# Patient Record
Sex: Female | Born: 1970 | Race: White | Hispanic: No | Marital: Single | Smoking: Current every day smoker
Health system: Southern US, Community
[De-identification: ages and names within clinical notes are randomized; demographics above are authoritative.]

## PROBLEM LIST (undated history)

## (undated) DIAGNOSIS — Z9889 Other specified postprocedural states: Secondary | ICD-10-CM

## (undated) DIAGNOSIS — R112 Nausea with vomiting, unspecified: Secondary | ICD-10-CM

## (undated) DIAGNOSIS — F32A Depression, unspecified: Secondary | ICD-10-CM

## (undated) DIAGNOSIS — E039 Hypothyroidism, unspecified: Secondary | ICD-10-CM

## (undated) DIAGNOSIS — G43909 Migraine, unspecified, not intractable, without status migrainosus: Secondary | ICD-10-CM

## (undated) DIAGNOSIS — F419 Anxiety disorder, unspecified: Secondary | ICD-10-CM

## (undated) DIAGNOSIS — R011 Cardiac murmur, unspecified: Secondary | ICD-10-CM

## (undated) DIAGNOSIS — J45909 Unspecified asthma, uncomplicated: Secondary | ICD-10-CM

## (undated) DIAGNOSIS — I10 Essential (primary) hypertension: Secondary | ICD-10-CM

## (undated) DIAGNOSIS — D1801 Hemangioma of skin and subcutaneous tissue: Secondary | ICD-10-CM

## (undated) DIAGNOSIS — F1010 Alcohol abuse, uncomplicated: Secondary | ICD-10-CM

## (undated) HISTORY — DX: Other specified postprocedural states: Z98.890

## (undated) HISTORY — PX: HEMANGIOMA EXCISION: SHX1734

## (undated) HISTORY — PX: LAPAROSCOPIC, ABDOMINAL EXPLORATION: SHX4470

## (undated) HISTORY — DX: Essential (primary) hypertension: I10

## (undated) HISTORY — DX: Hemangioma of skin and subcutaneous tissue: D18.01

## (undated) HISTORY — DX: Alcohol abuse, uncomplicated: F10.10

## (undated) HISTORY — PX: TONSILLECTOMY: SUR1361

## (undated) HISTORY — DX: Anxiety disorder, unspecified: F41.9

## (undated) HISTORY — PX: OTHER SURGICAL HISTORY: SHX169

## (undated) HISTORY — DX: Depression, unspecified: F32.A

## (undated) HISTORY — DX: Cardiac murmur, unspecified: R01.1

---

## 2001-03-30 ENCOUNTER — Emergency Department: Admit: 2001-03-30 | Payer: Self-pay | Source: Emergency Department | Admitting: Emergency Medicine

## 2005-09-28 ENCOUNTER — Ambulatory Visit
Admit: 2005-09-28 | Disposition: A | Discharge status: Home / Self Care | Payer: Self-pay | Source: Ambulatory Visit | Admitting: Body Imaging

## 2005-11-29 ENCOUNTER — Ambulatory Visit
Admit: 2005-11-29 | Disposition: A | Discharge status: Home / Self Care | Payer: Self-pay | Source: Ambulatory Visit | Admitting: Body Imaging

## 2009-03-12 ENCOUNTER — Ambulatory Visit: Admit: 2009-03-12 | Disposition: A | Discharge status: Home / Self Care | Payer: Self-pay | Source: Ambulatory Visit

## 2009-07-09 ENCOUNTER — Emergency Department: Admit: 2009-07-09 | Payer: Self-pay | Source: Emergency Department | Admitting: Emergency Medicine

## 2009-08-23 HISTORY — PX: THYROID SURGERY: SHX805

## 2009-08-23 HISTORY — PX: THYROIDECTOMY: SHX17

## 2009-09-12 ENCOUNTER — Ambulatory Visit
Admit: 2009-09-12 | Disposition: A | Discharge status: Home / Self Care | Payer: Self-pay | Source: Ambulatory Visit | Admitting: Surgery

## 2009-09-12 LAB — BASIC METABOLIC PANEL
BUN: 12 mg/dL (ref 7.0–19.0)
CO2: 24 mEq/L (ref 22–29)
Calcium: 9.7 mg/dL (ref 8.5–10.5)
Chloride: 103 mEq/L (ref 98–107)
Creatinine: 0.8 mg/dL (ref 0.6–1.0)
Glucose: 71 mg/dL (ref 70–100)
Potassium: 4.6 mEq/L (ref 3.5–5.1)
Sodium: 138 mEq/L (ref 136–145)

## 2009-09-12 LAB — CBC AND DIFFERENTIAL
Basophils Absolute: 0 10*3/uL (ref 0.00–0.20)
Basophils: 0 % (ref 0–2)
Eosinophils Absolute: 0.1 10*3/uL (ref 0.00–0.70)
Eosinophils: 1 % (ref 0–5)
Granulocytes Absolute: 5.5 10*3/uL (ref 1.80–8.10)
Hematocrit: 42.8 % (ref 37.0–47.0)
Hgb: 14.2 g/dL (ref 12.0–16.0)
Immature Granulocytes Absolute: 0.02 10*3/uL
Immature Granulocytes: 0 % (ref 0–1)
Lymphocytes Absolute: 2.3 10*3/uL (ref 0.50–4.40)
Lymphocytes: 28 % (ref 15–41)
MCH: 29.1 pg (ref 28.0–32.0)
MCHC: 33.2 g/dL (ref 32.0–36.0)
MCV: 87.7 fL (ref 80.0–100.0)
MPV: 10.3 fL (ref 9.4–12.3)
Monocytes Absolute: 0.5 10*3/uL (ref 0.00–1.20)
Monocytes: 6 % (ref 0–11)
Neutrophils %: 66 % (ref 52–75)
Platelets: 280 10*3/uL (ref 140–400)
RBC: 4.88 10*6/uL (ref 4.20–5.40)
RDW: 13 % (ref 12–15)
WBC: 8.41 10*3/uL (ref 3.50–10.80)

## 2009-09-12 LAB — HEMOLYSIS INDEX: Hemolysis Index: 4

## 2009-09-12 LAB — GFR: EGFR: >60

## 2009-09-22 ENCOUNTER — Ambulatory Visit: Payer: Self-pay

## 2009-09-22 ENCOUNTER — Ambulatory Visit
Admission: RE | Admit: 2009-09-22 | Disposition: A | Discharge status: Home / Self Care | Payer: Self-pay | Source: Ambulatory Visit | Admitting: Surgery

## 2009-09-22 LAB — MAGNESIUM: Magnesium: 1.9 mg/dL (ref 1.6–2.3)

## 2009-09-22 LAB — CALCIUM: Calcium: 9.4 mg/dL (ref 8.6–10.2)

## 2009-09-23 LAB — MAGNESIUM: Magnesium: 2 mg/dL (ref 1.6–2.3)

## 2009-09-23 LAB — CALCIUM: Calcium: 9.2 mg/dL (ref 8.6–10.2)

## 2009-09-30 ENCOUNTER — Ambulatory Visit
Admit: 2009-09-30 | Disposition: A | Discharge status: Home / Self Care | Payer: Self-pay | Source: Ambulatory Visit | Admitting: "Endocrinology

## 2009-09-30 LAB — BASIC METABOLIC PANEL
BUN: 10 mg/dL (ref 7.0–19.0)
CO2: 23 mEq/L (ref 22–29)
Calcium: 9.5 mg/dL (ref 8.5–10.5)
Chloride: 106 mEq/L (ref 98–107)
Creatinine: 0.7 mg/dL (ref 0.6–1.0)
Glucose: 75 mg/dL (ref 70–100)
Potassium: 4.8 mEq/L (ref 3.5–5.1)
Sodium: 140 mEq/L (ref 136–145)

## 2009-09-30 LAB — HEMOLYSIS INDEX: Hemolysis Index: 6

## 2009-09-30 LAB — GFR: EGFR: >60

## 2011-02-14 ENCOUNTER — Emergency Department
Admit: 2011-02-14 | Disposition: A | Discharge status: Home / Self Care | Payer: Self-pay | Source: Emergency Department | Admitting: Emergency Medicine

## 2011-02-23 ENCOUNTER — Emergency Department: Admit: 2011-02-23 | Disposition: A | Discharge status: Home / Self Care | Payer: Self-pay | Source: Emergency Department

## 2011-02-25 ENCOUNTER — Emergency Department: Admit: 2011-02-25 | Disposition: A | Discharge status: Home / Self Care | Payer: Self-pay | Source: Emergency Department

## 2011-03-03 ENCOUNTER — Emergency Department: Admit: 2011-03-03 | Disposition: A | Discharge status: Home / Self Care | Payer: Self-pay | Source: Emergency Department

## 2011-05-07 LAB — ECG 12-LEAD
Atrial Rate: 71 {beats}/min
P Axis: 41 degrees
P-R Interval: 146 ms
Q-T Interval: 396 ms
QRS Duration: 68 ms
QTC Calculation (Bezet): 430 ms
R Axis: 48 degrees
T Axis: 50 degrees
Ventricular Rate: 71 {beats}/min

## 2011-06-11 NOTE — Op Note (Unsigned)
Anna Lamb, Anna Lamb      MRN:          [id number]      Account:      [id number]      Document ID:  [id number]      Procedure Date: 09/22/2009            Admit Date: 09/22/2009            Patient Location: F1076-02      Patient Type: A            SURGEON: Anselm Pancoast Giuliana Handyside MD      ASSISTANT:  Baxter Hire A Rupell PA                  PREOPERATIVE DIAGNOSES:      1.  Multinodular thyroid goiter.      2.  Dominant right thyroid nodule.      3.  Small left nodule with calcification.            POSTOPERATIVE DIAGNOSES:      1.  Multinodular thyroid goiter.      2.  Dominant right thyroid nodule.      3.  Small left nodule with calcification.            TITLE OF PROCEDURE:      Total thyroidectomy.            COMPLICATIONS:      None.            ESTIMATED BLOOD LOSS:      Minimal.            ANESTHESIA:      General.            INDICATIONS:      The patient is a 40 year old woman kindly referred by her endocrinologist,      Dr. Ella Jubilee, and primary physician, Dr. Otis Brace, for surgical      evaluation.  The patient's history and lab values were reviewed.  The      rationale for considering operation on the thyroid was discussed in detail      including risks and benefits.  With full understanding and without      reservation, the patient elected to proceed with operative removal of the      thyroid on this date.            On the day of surgery, I met with the patient preoperatively.  Final      questions were answered.  The boarding pass and informed consent were      complete.  She was covered prophylactically with vancomycin secondary to a      PENICILLIN allergy and she was taken to the operating room.                                         Page 1 of 3      Anna Lamb, Anna Lamb      MRN:          [id number]      Account:      [id number]      Document ID:  [id number]      Procedure Date: 09/22/2009            DESCRIPTION OF PROCEDURE:      The patient was placed on the operative table  in the supine position.       General anesthesia was induced.  The patient required multiple attempts at      intubation, finally intubated with the Bullard technique.  This precluded      the use of the Nerve Integrity Monitoring System.  Once the patient was      intubated, her neck was positioned and then prepped and draped in the usual      sterile fashion.  The neck was entered through a transverse incision      paralleling a skin fold to help optimize cosmesis.  The incision was      carried down through the platysma.  Skin flaps were elevated and held in      position with silk suture.  The strap muscles were divided in the midline      and attention first focused to the left thyroid lobe.  The lobe was of      normal size.  It was slightly stiffer than normal, suggesting a component      of thyroiditis.  There were several small lymph nodes adjacent to the      thyroid, again typical of the inflammatory change rather than malignancy.      As the gland was rotated anteriorly and medially, the middle thyroid vein      was isolated, ligated with silk, and divided on the thyroid side with the      Harmonic scalpel.  With further rotation of the gland, the posterior      elements, including the inferior thyroid artery and the nerve, were      identified.  Attention was then focused to the upper pole, which was      retracted laterally and inferiorly, exposing the superior vessels.  Once      these were isolated, they were ligated with 2-0 silk and divided with the      Harmonic scalpel.  Remaining vessels penetrating into the upper pole were      taken directly on the thyroid capsule to preserve the superior parathyroid      on the left.  When the upper pole was mobilized, attention was focused      inferiorly.  The veins penetrating into the lower pole of the left lobe and      isthmus were isolated directly on the capsule of the gland, ligated with      silk suture, and again divided with the Harmonic scalpel.  Once the upper      and  lower poles were mobilized, the gland could then be completely      delivered out of the neck and rotated anteriorly on the trachea.  The      nerve, which was in full view was tracked up to its entrance through Ailey      ligament into the thyrocricoid membrane; and only when the nerve was in      full view, were remaining vessels penetrating into the left lobe taken in a      caudal-to-cephalad direction until the entire left lobe and isthmus was      released off the pretracheal fascia.  At this point, attention was focused      to the right lobe.  The right lobe of the thyroid was exposed by retracting      the muscles laterally.  The right lobe was essentially replaced by a      dominant nodule encompassing the majority of the gland.  There did not      appear to be any gross penetration of the nodule through the capsule nor      any extrathyroidal masses of concern.  Again, the middle thyroid vein was      isolated, ligated, and divided and the upper and lower poles were liberated      from their blood supply in similar fashion as described on the left.      During dissection, both parathyroids were again visualized and preserved.      Once the upper and lower poles were released from their vascular supply,      the gland was completely delivered out of the neck and rotated medially.      With the nerve again in full view, remaining vessels were taken in a                                   Page 2 of 3      Anna Lamb, Anna Lamb      MRN:          [id number]      Account:      [id number]      Document ID:  [id number]      Procedure Date: 09/22/2009            caudal-to-cephalad direction until the entire thyroid was released and      delivered out of the neck.  A silk stitch was placed at the upper pole of      the right lobe for pathologic orientation.  The neck was checked and was      dry.  The nerves were reevaluated and were completely intact.  The      parathyroids were viable.  With no bleeding, decision was  made not to leave      a drain.  The midline was closed with running 3-0 Vicryl, subcutaneous      tissue and platysma with inverting simple 4-0 Vicryl, and the skin with      Monocryl.  Wounds were cleansed and dressed sterilely.  The patient      tolerated the procedure well, had no intraoperative complication, and was      transferred to the outpatient recovery area in stable condition.  All      sponge, needle, and instrument counts reported as correct by the nursing      staff at the conclusion of the case.                        Electronic Signing Provider            D:  09/22/2009 09:22 AM by Dr. Arnette Norris, MD [phone])      T:  09/22/2009 11:58 AM by Jonathon Bellows                  MW:NUUVOZD Corrin Parker MD      Ella Jubilee MD      Phillis Knack MD                                   Page 3 of 3      Authenticated by Arnette Norris, MD On 09/25/2009 02:03:33 PM

## 2012-09-05 ENCOUNTER — Emergency Department
Admission: EM | Admit: 2012-09-05 | Discharge: 2012-09-05 | Disposition: A | Discharge status: Home / Self Care | Payer: BLUE CROSS/BLUE SHIELD | Attending: Emergency Medicine | Admitting: Emergency Medicine

## 2012-09-05 ENCOUNTER — Emergency Department: Payer: BLUE CROSS/BLUE SHIELD

## 2012-09-05 DIAGNOSIS — S93409A Sprain of unspecified ligament of unspecified ankle, initial encounter: Secondary | ICD-10-CM | POA: Insufficient documentation

## 2012-09-05 DIAGNOSIS — X500XXA Overexertion from strenuous movement or load, initial encounter: Secondary | ICD-10-CM | POA: Insufficient documentation

## 2012-09-05 DIAGNOSIS — Z882 Allergy status to sulfonamides status: Secondary | ICD-10-CM | POA: Insufficient documentation

## 2012-09-05 DIAGNOSIS — F17200 Nicotine dependence, unspecified, uncomplicated: Secondary | ICD-10-CM | POA: Insufficient documentation

## 2012-09-05 DIAGNOSIS — X503XXA Overexertion from repetitive movements, initial encounter: Secondary | ICD-10-CM | POA: Insufficient documentation

## 2012-09-05 DIAGNOSIS — Z880 Allergy status to penicillin: Secondary | ICD-10-CM | POA: Insufficient documentation

## 2012-09-05 MED ORDER — ACETAMINOPHEN 325 MG PO TABS
650.0000 mg | ORAL_TABLET | Freq: Once | ORAL | Status: AC
Start: 2012-09-05 — End: 2012-09-05
  Administered 2012-09-05: 650 mg via ORAL
  Filled 2012-09-05: qty 2

## 2012-09-05 NOTE — ED Provider Notes (Signed)
EMERGENCY DEPARTMENT HISTORY AND PHYSICAL EXAM     Physician/Midlevel provider first contact with patient: 09/05/12 0919         Date: 09/05/2012  Patient Name: Anna Lamb  Attending Physician: Ok Edwards MD  Diagnosis and Treatment Plan       Clinical Impression:   1. Ankle sprain        Treatment Plan:   ED Disposition     Discharge [name] discharge to home/self care.    Condition at discharge: Stable            History of Presenting Illness     Chief Complaint   Patient presents with   . Foot Injury       History Provided By: Patient    Chief Complaint: Foot Pain  Onset: Today AM  Timing: Suddenly  Location: Right Foot  Quality: Painful Swelling  Severity: Moderate  Modifying Factors:   Associated Symptoms:     Additional History: Anna Lamb is a 42 y.o. female pt c/o right foot pain since this morning. Pt reports tying her shoes when she lost her balance and "heard two pops." Pt is able to bear weight and says she walks with a limp. Ecchymosis is noted to right foot. Pt is requesting X-Ray.   Denies any other injury.     PCP: Milas Kocher, MD      Current Facility-Administered Medications   Medication Dose Route Frequency Provider Last Rate Last Dose   . [COMPLETED] acetaminophen (TYLENOL) tablet 650 mg  650 mg Oral Once Ok Edwards, MD   650 mg at 09/05/12 1610     No current outpatient prescriptions on file.       Past Medical History     History reviewed. No pertinent past medical history.  Past Surgical History   Procedure Date   . Thyroid surgery        Family History     No family history on file.    Social History     History     Social History   . Marital Status: Single     Spouse Name: N/A     Number of Children: N/A   . Years of Education: N/A     Social History Main Topics   . Smoking status: Current Every Day Smoker   . Smokeless tobacco: Not on file   . Alcohol Use: No   . Drug Use:    . Sexually Active: Not on file     Other Topics Concern   . Not on file     Social  History Narrative   . No narrative on file        Allergies     Allergies   Allergen Reactions   . Penicillin V Potassium Hives   . Sulfa Antibiotics Hives       Review of Systems     Review of Systems:  General: negative for chills, fever or malaise  Psychological: negative for depression or suicidal ideation  ENT: negative for epistaxis, nasal congestion or sore throat  Respiratory: no cough, shortness of breath, or wheezing  Cardiovascular: no chest pain or dyspnea on exertion  Gastrointestinal: no abdominal pain, change in bowel habits, or black or bloody stools  Genito-Urinary: no dysuria, trouble voiding, or hematuria  Musculoskeletal: +Right Foot Pain  Neurological: no TIA or stroke symptoms  Dermatological: negative for pruritus and rash; + Ecchymosis to right foot    Physical Exam  BP 114/57  Pulse 86  Temp 97.9 F (36.6 C)  Resp 16  Ht 1.676 m  Wt 74.844 kg  BMI 26.64 kg/m2  SpO2 97%  LMP 08/17/2012    Constitutional: Vital signs reviewed. Well appearing, no apparent distress  Neuro: alert and oriented x 3, CN II - XII intact, upper and lower extremities 5/5 strength intact, sensation intact, normal gait  Head: Normocephalic, atraumatic. No external trauma noted.  Eyes: Conjunctiva and sclera are normal. Extraocular movements intact, pupils equal, round, reactive  Ear, Nose, Throat:  Normal external examination of the nose and ears. Oropharynx clear, no tonsillar exudates. Uvula midline.  Neck: Normal range of motion. Trachea midline. No midline cervical spine tenderness. No cervical lymphadenopathy  Respiratory/Chest: Clear to auscultation. No respiratory distress. No wheezing, rhonchi or rales.  Cardiovascular: Regular rate and rhythm. No murmurs.  Abdomen: Soft. Non-tender to palpation. No masses. No guarding or rebound.  Back: No midline tenderness, no CVA tenderness.   Extremities: Upper and lower extremities with no edema, no cyanosis. No tenderness over medial/latearl malleolus no 5th  metatarsal tenderness  Skin: Mild ecchymosis over proximal dorsum over right foot    Diagnostic Study Results     Labs -     Results     ** No Results found for the last 24 hours. **          Radiologic Studies -   Radiology Results (24 Hour)     Procedure Component Value Units Date/Time    Ankle Right 3+ Views [[id number]] Collected:09/05/12 0953    Order Status:Completed  Updated:09/05/12 1000    Narrative:    HISTORY: Pain after trauma     EXAM: Right ankle, 4 views     FINDINGS: There is a no acute fracture or subluxation seen. The mortise  is intact. Minimal soft tissue is seen. No focal articular abnormality  is identified.     Two small 3 mm cortical densities are seen off the navicular on the  lateral view consistent with old trauma.       Impression:      1. No acute fracture or subluxation seen. Findings consistent with old  trauma.      .    Medical Decision Making   I am the first provider for this patient.    I reviewed the vital signs, available nursing notes, past medical history, past surgical history, family history and social history.    Vital Signs-Reviewed the patient's vital signs.     Patient Vitals for the past 12 hrs:   BP Temp Pulse Resp   09/05/12 0908 114/57 mmHg 97.9 F (36.6 C) 86  16        ED Course:   10:08 AM  Discussed test results with pt and counseled on diagnosis, f/u plans, and signs and symptoms when to return to ED.  Pt is stable and ready for discharge.      Provider Notes: fall, twisted ankle, likely sprained  Patient requested x-ray despite negative [place] ankle rules  XR negative, will ace wrap and give RICE instructions      Doctor's Notes     Throughout the stay in the Emergency Department, questions and concerns surrounding pain control, care plans, diagnostic studies, effects of medications administered or prescribed, and future prognostic dilemmas were assessed and addressed.    ROS addendum: The patient and/or family was asked if they had any other complaints or  concerns that we could address today and nothing of  significance was noted.     _______________________________  Medical DeMedical Decision Makingcision Stacy Gardner  Attestations:    This note is prepared by Davina Poke, acting as Scribe for Ok Edwards, MD.     Ok Edwards, MD: The scribe's documentation has been prepared under my direction and personally reviewed by me in its entirety.  I confirm that the note above accurately reflects all work, treatment, procedures, and medical decision making performed by me.          Ok Edwards, MD  09/05/12 1024

## 2012-09-05 NOTE — ED Notes (Signed)
Report of tieing her shoes while standing lost balance heard 2 pops to the right foot and fell. Unable to bear weight initially. Now able to bear weight with pain. Discoloration and swelling noted to the lateral of the foot proximal to the ankle.

## 2012-09-05 NOTE — Discharge Instructions (Signed)
Ankle Sprain    You have been diagnosed with an ankle sprain.    A sprain is a ligament injury, usually a tear or partial tear. Sprains can hurt as much as broken bones. Sprains can be classified by the degree of injury. A first-degree sprain is considered a minor tear. A second-degree sprain is a partial tear of the ligament. A third-degree sprain often involves a small fracture, or break, of the bone that the ligament is attached to.    Sprains are usually treated with pain medication and a splint to keep the joint from moving. You should Rest, Ice, Compress, and Elevate the injured ankle. Remember this as "RICE."   REST: Limit the use of the injured body part.   ICE: By applying ice to the affected area, swelling and pain can be reduced. Place some ice cubes in a re-sealable (Ziploc) bag and add some water. Put a thin washcloth between the bag and the skin. Apply the ice bag to the area for at least 20 minutes. Do this at least 4 times per day. Using the ice for longer times and more frequently is OK. NEVER APPLY ICE DIRECTLY TO THE SKIN.   COMPRESS: Compression means to apply pressure around the injured area such as with a splint, cast or an ace bandage. Compression decreases swelling and improves comfort. Compression should be tight enough to relieve swelling but not so tight as to decrease circulation. Increasing pain, numbness, tingling, or change in skin color, are all signs of decreased circulation.   ELEVATE: Elevate the injured part. For example, a sprained ankle can be placed up on a chair while sitting and propped up on pillows while lying down.    You have been given an ACE BANDAGE. The bandage will compress the ankle. This increases comfort and reduces swelling. The ACE bandage should fit snugly but not so tight as to decrease circulation (blood supply). Watch for swelling of the area outside the ace wrap. Check capillary refill (circulation) in your toenails. To do this, press on the nail. It  should turn white. When you let go, the nail should return to pink in less than 2 seconds. If it doesn't, the bandage is too tight. Loosen the wrap if you need to.    Wear the ACE bandage:   For the next 1-2 weeks.    Ankle exercises are described below. Begin the exercises as soon as you are able. They will make the ankle stronger to prevent new injuries. Do the exercises 5 to 10 times each day.   Use your big toe to draw out the letters of the alphabet on the ground. Move your ankle as you make each letter.   Sit with your leg straight out in front of you. Wrap a towel around the ball of your foot (just below your toes) and pull back. Pull hard enough to stretch the ankle. Don't pull hard enough to cause pain. Hold the stretch for 30 seconds.   Stand up. Rock onto the tiptoes of the injured foot and then return to the flat position. Repeat 10 times.   Rotate your ankle in a circle. Make 10 clockwise circles, then make 10 circles going the other way.    YOU SHOULD SEEK MEDICAL ATTENTION IMMEDIATELY, EITHER HERE OR AT THE NEAREST EMERGENCY DEPARTMENT, IF ANY OF THE FOLLOWING OCCURS:   Your pain gets much worse.   Your ankle or foot starts to tingle or it becomes numb.   Your foot   is cold or pale. This might mean there is a problem with circulation (blood supply).        R.I.C.E.  R.I.C.E. stands for Rest, Ice, Compression, and Elevation. Doing these things helps limit pain and swelling after an injury. R.I.C.E. also helps injuries heal faster. Use R.I.C.E. for sprains, strains, and severe bruises or bumps. Follow the tips on this handout and begin R.I.C.E. as soon as possible after an injury.   Rest  Pain is your body's way of telling you to rest an injured area. Whether you have hurt an elbow, hand, foot, or knee, limiting its use will prevent further injury and help you heal.   Ice  Applying ice right after an injury helps prevent swelling and reduce pain. Don't place ice directly on your  skin.   Wrap a cold pack or bag of ice in a thin cloth. Place it over the injured area.   Ice for63minutes every3hours. Don't ice for more than51minutes at a time.   Compression  Putting pressure (compression) on an injury helps prevent swelling and provides support.   Wrap the injured area firmly with an elastic bandage. If your hand or foot tingles, becomes discolored, or feels cold to the touch, the bandage may be too tight. Rewrap it more loosely.   If your bandage becomes too loose, rewrap it.   Do not wear an elastic bandage overnight.   Elevation   Keeping an injury elevated helps reduce swelling, pain, and throbbing. Elevation is most effectivewhen the injury is kept elevated higher than the heart.    Call your health care provider if you notice any of the following:   Fingers or toes feel numb, are cold to the touch, or change color   Skin looks shiny or tight   Pain, swelling, or bruising worsens and is not improved with elevation    [address], [address] [place], [place] [address]. All rights reserved. This information is not intended as a substitute for professional medical care. Always follow your healthcare professional's instructions.

## 2013-04-18 ENCOUNTER — Ambulatory Visit (INDEPENDENT_AMBULATORY_CARE_PROVIDER_SITE_OTHER): Payer: BLUE CROSS/BLUE SHIELD

## 2013-04-19 ENCOUNTER — Ambulatory Visit (INDEPENDENT_AMBULATORY_CARE_PROVIDER_SITE_OTHER): Payer: BLUE CROSS/BLUE SHIELD | Admitting: Psychiatry

## 2013-04-19 DIAGNOSIS — F411 Generalized anxiety disorder: Secondary | ICD-10-CM

## 2013-04-19 NOTE — Progress Notes (Signed)
OUTPATIENT INITIAL PSYCHIATRIC EVALUATION    Date of evaluation: 04/19/13    Chief Complaint:  Depression and anxiety    History of Presenting Illness:  Patient is a 42 y.o. white female who presents for an initial psychiatric evaluation.  Pt has been out on leave, and states that she came to get a letter requesting that she return to work under a Radiographer, therapeutic.  Pt states that she has been an anxious person, but denies pre-existing history of depression, states that she was having depression since January 2014 and was on cymbalta eventually titrated to 90 mg daily, though primarily for anxiety.  Pt states that recently she got into argument with her supervisor and her supervisor said "I could understand why people like Brandt Loosen kill themselves," but pt denies that by this comment she was having any SI, intent, plan.  Pt states that supervisor misinterpreted the statement and eventually [facility] came to evaluate the patient.  Pt states that they did not deem her a risk to herself; pt states that she told evaluator that she had thoughts of going across the desk and hurting her supervisor.  Pt however again states that she was not having homicidal thoughts, intent or plan, but that she was mainly angry.  She said the evaluator suggested that pt go to [facility] to "cover my ass," so that pt could keep her job.  Pt was at [facility] for 2 days.  Pt denies having any current SI/HI, intent or plan, history of suicide attempts or violence.  Denies history of mania, avh, delusions.    Past Psychiatric History:   Inpatient tx: yes as per HPI  Outpt psychiatric tx and medication trials: cymbalta  Therapy: yes - currently seeing a therapist    Past Medical and Surgical History:    Multinodular thyroid goiter    Past Surgical History   Procedure Date   . Thyroid surgery       Medications:  Cymbalta 90 mg qhs  Albuterol  Fluticasone-salmeterol  Synthroid    Allergies:   Allergies   Allergen  Reactions   . Penicillin V Potassium Hives   . Sulfa Antibiotics Hives     Review of systems:  All systems reviewed and are negative except as noted above and in the history of present illness.    Family History:  No family history on file.     Substance Use History:  Denies h/o drug or etoh abuse.    Social history:  Works for [employer] Status Evaluation:  General Appearance and Manner: Grooming, hygiene, dress appropriate, appears stated age, average weight.  Cooperative.  Eye contact appropriate.    Psychomotor: No abnormal movements, tremors, gait or posture disturbances.    Speech: Normal rate, volume, articulation, spontaneous.  Mood: Anxious  Affect: Mood-congruent, range normal.  Thought process: Logical, sequential, and goal directed.  Associations normal.  Thought content: No delusions, AVH.  No SI/HI.  Insight: poor  Judgment: fair  Cognition:     Orientation: AOx 3    Memory: Intact    Concentration: Intact    Language: Intact    Fund of knowledge: Intact    Abstract abilities: Intact      DIAGNOSES:  Axis I:  Anxiety disorder NOS  Axis II: Defer  Axis III:   Multinodular thyroid goiter  Past Surgical History   Procedure Date   . Thyroid surgery     Axis IV: Occupational problems  Axis V: 63.            ASSESSMENT / PLAN  - Pt presents with anxiety and depression, on cymbalta 90 mg qhs, currently treated by her PCP.  Was hospitalized briefly at [facility] as pt states that she made statements at work that led her supervisor to be concerned and eventually was evaluated by [facility].  Pt denied that she actually had any suicidal ideation or homicidal ideation then or now, and has not had any intent or plans to hurt herself, her supervisor or anyone else.  Denies history of suicide attempts or violence.  Pt has been on administrative leave, and was mainly upset and frustrated with her supervisor the day she was hospitalized.  She requested a letter stating that she should  return to work under a Radiographer, therapeutic, and I advised pt that I could not write this letter today.  Pt did not want to continue psychiatric treatment with this provider.  Pt was offered referrals.  Pt refused these referrals, but will f/u with her primary care doctor.  - Potential risks/benefits/alternatives of medications and treatment plan discussed with patient, who expressed understanding of our discussion and gave informed consent to the treatment plan.  - Psychotherapy: Supportive therapy and education provided to the patient.    - Substance abuse tx: Advised patient to limit and abstain from etoh/drug use, and to take medications appropriately as prescribed.    - Patient agrees with safety plan which includes calling 911 / [facility] or going to the nearest ER / [facility] if the pt is in crisis or experiencing an emergency.    Terrilyn Saver, MD

## 2014-07-19 ENCOUNTER — Emergency Department: Payer: BLUE CROSS/BLUE SHIELD

## 2014-07-19 ENCOUNTER — Emergency Department
Admission: EM | Admit: 2014-07-19 | Discharge: 2014-07-19 | Disposition: A | Discharge status: Home / Self Care | Payer: BLUE CROSS/BLUE SHIELD | Attending: Emergency Medicine | Admitting: Emergency Medicine

## 2014-07-19 DIAGNOSIS — R911 Solitary pulmonary nodule: Secondary | ICD-10-CM

## 2014-07-19 DIAGNOSIS — F17210 Nicotine dependence, cigarettes, uncomplicated: Secondary | ICD-10-CM | POA: Insufficient documentation

## 2014-07-19 DIAGNOSIS — J45909 Unspecified asthma, uncomplicated: Secondary | ICD-10-CM | POA: Insufficient documentation

## 2014-07-19 DIAGNOSIS — K529 Noninfective gastroenteritis and colitis, unspecified: Secondary | ICD-10-CM | POA: Insufficient documentation

## 2014-07-19 DIAGNOSIS — E079 Disorder of thyroid, unspecified: Secondary | ICD-10-CM | POA: Insufficient documentation

## 2014-07-19 HISTORY — DX: Depression, unspecified: F32.A

## 2014-07-19 HISTORY — DX: Anxiety disorder, unspecified: F41.9

## 2014-07-19 HISTORY — DX: Migraine, unspecified, not intractable, without status migrainosus: G43.909

## 2014-07-19 LAB — CBC AND DIFFERENTIAL
Basophils Absolute Automated: 0.03 10*3/uL (ref 0.00–0.20)
Basophils Automated: 0 %
Eosinophils Absolute Automated: 0.21 10*3/uL (ref 0.00–0.70)
Eosinophils Automated: 3 %
Hematocrit: 41.3 % (ref 37.0–47.0)
Hgb: 14.2 g/dL (ref 12.0–16.0)
Lymphocytes Absolute Automated: 1.88 10*3/uL (ref 0.50–4.40)
Lymphocytes Automated: 25 %
MCH: 30.7 pg (ref 28.0–32.0)
MCHC: 34.4 g/dL (ref 32.0–36.0)
MCV: 89.2 fL (ref 80.0–100.0)
MPV: 9.2 fL — ABNORMAL LOW (ref 9.4–12.3)
Monocytes Absolute Automated: 0.77 10*3/uL (ref 0.00–1.20)
Monocytes: 10 %
Neutrophils Absolute: 4.67 10*3/uL (ref 1.80–8.10)
Neutrophils: 62 %
Platelets: 269 10*3/uL (ref 140–400)
RBC: 4.63 10*6/uL (ref 4.20–5.40)
RDW: 13 % (ref 12–15)
WBC: 7.56 10*3/uL (ref 3.50–10.80)

## 2014-07-19 LAB — URINALYSIS, REFLEX TO MICROSCOPIC EXAM IF INDICATED
Bilirubin, UA: NEGATIVE
Glucose, UA: NEGATIVE
Ketones UA: NEGATIVE
Leukocyte Esterase, UA: NEGATIVE
Nitrite, UA: NEGATIVE
Protein, UR: NEGATIVE
Specific Gravity UA: 1.01 (ref 1.001–1.035)
Urine pH: 6 (ref 5.0–8.0)
Urobilinogen, UA: NORMAL mg/dL

## 2014-07-19 LAB — COMPREHENSIVE METABOLIC PANEL
ALT: 9 U/L (ref 0–55)
AST (SGOT): 18 U/L (ref 5–34)
Albumin/Globulin Ratio: 1.8 (ref 0.9–2.2)
Albumin: 4 g/dL (ref 3.5–5.0)
Alkaline Phosphatase: 47 U/L (ref 37–106)
Anion Gap: 6 (ref 5.0–15.0)
BUN: 10 mg/dL (ref 7.0–19.0)
Bilirubin, Total: 0.5 mg/dL (ref 0.2–1.2)
CO2: 23 mEq/L (ref 22–29)
Calcium: 8.8 mg/dL (ref 8.5–10.5)
Chloride: 110 mEq/L (ref 100–111)
Creatinine: 0.8 mg/dL (ref 0.6–1.0)
Globulin: 2.2 g/dL (ref 2.0–3.6)
Glucose: 87 mg/dL (ref 70–100)
Potassium: 4.4 mEq/L (ref 3.5–5.1)
Protein, Total: 6.2 g/dL (ref 6.0–8.3)
Sodium: 139 mEq/L (ref 136–145)

## 2014-07-19 LAB — LIPASE: Lipase: 39 U/L (ref 8–78)

## 2014-07-19 LAB — URINE HCG QUALITATIVE: Urine HCG Qualitative: NEGATIVE

## 2014-07-19 LAB — GFR: EGFR: >60

## 2014-07-19 MED ORDER — ONDANSETRON 4 MG PO TBDP
4.0000 mg | ORAL_TABLET | Freq: Four times a day (QID) | ORAL | Status: DC | PRN
Start: 2014-07-19 — End: 2018-02-03

## 2014-07-19 MED ORDER — DIPHENOXYLATE-ATROPINE 2.5-0.025 MG PO TABS
1.0000 | ORAL_TABLET | Freq: Four times a day (QID) | ORAL | Status: DC | PRN
Start: 2014-07-19 — End: 2018-01-25

## 2014-07-19 MED ORDER — SODIUM CHLORIDE 0.9 % IV BOLUS
1000.0000 mL | Freq: Once | INTRAVENOUS | Status: AC
Start: 2014-07-19 — End: 2014-07-19
  Administered 2014-07-19: 1000 mL via INTRAVENOUS

## 2014-07-19 MED ORDER — IOHEXOL 350 MG/ML IV SOLN
100.0000 mL | Freq: Once | INTRAVENOUS | Status: AC | PRN
Start: 2014-07-19 — End: 2014-07-19
  Administered 2014-07-19: 100 mL via INTRAVENOUS

## 2014-07-19 NOTE — Discharge Instructions (Signed)
Enteritis    You have been diagnosed with acute enteritis.    Enteritis is an inflammation of the stomach and small intestine. Food with bacteria, bacterial toxins or viruses is often the cause.    Enteritis symptoms include crampy abdominal (belly) pain. They sometimes include fever (temperature higher than 100.27F / 38C) and/or diarrhea. The diarrhea is sometimes bloody.    It is important to keep yourself hydrated. You should avoid alcoholic drinks, soda pop or other drinks with caffeine. These can cause you to lose fluid and become dehydrated. You should also avoid drinks that contain a lot of sugar, like apple or pear juice, as this can make your diarrhea worse. You should drink fluids like Gatorade or Pedialyte.    Acute enteritis is treated with medicine to help the diarrhea. Some patients need antibiotics. It might help to avoid eating for the next day. Instead, drink clear liquids, like water, broth, or juice that has been diluted (watered down). Avoid large heavy meals that may make you sick to your stomach. When you start feeling better, you can eat more. You will probably feel better in a few days, but it might take up to a week.    Ask the doctor before using antidiarrheal medicines. In some cases they can be harmful.    Talk to your regular doctor if you do not feel better or you have new symptoms or concerns. Otherwise, you do not need to follow up with a doctor.    YOU SHOULD SEEK MEDICAL ATTENTION IMMEDIATELY FOR YOURSELF, EITHER HERE OR AT THE NEAREST EMERGENCY DEPARTMENT, IF ANY OF THE FOLLOWING OCCURS:   You show signs of dehydration. Symptoms of dehydration include weakness, lightheadedness or dizziness when standing, dry or sticky mouth and not urinating for six hours or more.   You cannot keep clear liquids down or you have vomit that is dark green or bloody.   You have bloody diarrhea, especially if you have already received treatment.   You have stomach pain that gets worse or  does not improve after 24 hours.   You notice a rash.   You do not feel better after treatment.   Your symptoms get worse at any time.            Incidental CXR Findings    The Chest X-ray done during your visit showed something abnormal.    The type of abnormality on the X-ray is an "incidental finding." This means the X-ray was done to look for one problem (a pneumonia, fluid, etc.), but the X-ray showed a different finding.    The radiologist found a spot on the X-ray that needs to be rechecked with another test.    The medical staff gave you a follow-up appointment for an X-Ray or a CT scan. It is VERY IMPORTANT to keep this appointment. It is also VERY IMPORTANT to follow up with your doctor or referral doctor. Talk with him or her about the X-ray/CT findings.

## 2014-07-19 NOTE — ED Notes (Signed)
See quick triage

## 2014-07-19 NOTE — ED Provider Notes (Signed)
EMERGENCY DEPARTMENT HISTORY AND PHYSICAL EXAM     Physician/Midlevel provider first contact with patient: 07/19/14 1059         Date: 07/19/2014  Patient Name: Anna Lamb    History of Presenting Illness     Chief Complaint   Patient presents with   . Abdominal Pain       History Provided By: Patient     Chief Complaint: Diarrhea   Onset: last night  Quality: bloody diarrhea   Severity: moderate  Modifying Factors: none    Associated Symptoms: nausea and abd cramping     Additional History: Anna Lamb is a 43 y.o. female who presents to ED due to 8-10 episodes of bloody diarrhea x last night. Pt notices significant amount of blood in her diarrhea episodes. Pt's last diarrhea was 1 hour PTA and it was mainly blood. Pt also has associated nausea without vomiting and abdominal pain (cramping sensation). Pt sts that she had epigastric pain last night that radiated down to her lower abdomen x this AM. Pt notices onset of her sxs after eating chocolate pie for Thanksgiving and has h/o lactose intolerance. Otherwise, pt has no underlying GI issues like colitis or diverticulitis. No prior abdominal surgery. Pt is currently on her menstrual period.      Pt denies difficulty urinating, rectal pain, or any other symptoms.      PCP: Milas Kocher, MD      No current facility-administered medications for this encounter.     Current Outpatient Prescriptions   Medication Sig Dispense Refill   . ADVAIR DISKUS 250-50 MCG/DOSE Aerosol Powder, Breath Activtivatede      . diphenoxylate-atropine (LOMOTIL) 2.5-0.025 MG per tablet Take 1 tablet by mouth 4 (four) times daily as needed for Diarrhea. 30 tablet 0   . DULoxetine (CYMBALTA) 60 MG capsule      . meloxicam (MOBIC) 15 MG tablet      . ondansetron (ZOFRAN ODT) 4 MG disintegrating tablet Take 1 tablet (4 mg total) by mouth every 6 (six) hours as needed for Nausea. 8 tablet 0   . SYNTHROID 125 MCG tablet          Past History     Past Medical History:  Past Medical  History   Diagnosis Date   . Anxiety    . Disorder of thyroid    . Depression    . Asthma    . Migraine        Past Surgical History:  Past Surgical History   Procedure Laterality Date   . Thyroid surgery         Family History:  No family history on file.    Social History:  History   Substance Use Topics   . Smoking status: Current Every Day Smoker -- 0.50 packs/day for 25 years     Types: Cigarettes   . Smokeless tobacco: Not on file   . Alcohol Use: No       Allergies:  Allergies   Allergen Reactions   . Penicillin V Potassium Hives   . Sulfa Antibiotics Hives       Review of Systems     Review of Systems   Constitutional: Negative for fever and chills.   HENT: Negative for ear discharge and nosebleeds.    Eyes: Negative for discharge and redness.   Respiratory: Negative for choking.    Gastrointestinal: Positive for nausea, abdominal pain, diarrhea and blood in stool. Negative for vomiting and rectal  pain.   Genitourinary: Negative for difficulty urinating.   Musculoskeletal: Negative for gait problem.   Skin: Negative for pallor and rash.   Allergic/Immunologic: Negative for food allergies.   Neurological: Negative for tremors.   Hematological: Does not bruise/bleed easily.     Physical Exam   BP 138/75 mmHg  Pulse 68  Temp(Src) 97.7 F (36.5 C)  Resp 16  Ht 1.676 m  Wt 77.111 kg  BMI 27.45 kg/m2  SpO2 100%  LMP 07/16/2014      Physical Exam   Constitutional: She is oriented to person, place, and time and well-developed, well-nourished, and in no distress. No distress.   HENT:   Head: Normocephalic and atraumatic.   Mouth/Throat: Oropharynx is clear and moist. No oropharyngeal exudate.   Eyes: EOM are normal. Pupils are equal, round, and reactive to light.   Neck: Normal range of motion. Neck supple.   Cardiovascular: Normal rate, regular rhythm, normal heart sounds and intact distal pulses.    Pulmonary/Chest: Effort normal and breath sounds normal. No respiratory distress. She has no wheezes. She  has no rales.   Abdominal: Soft. Bowel sounds are normal. She exhibits no distension. There is tenderness ( lower abd tenderness). There is no rebound and no guarding.       Genitourinary: Guaiac positive stool ( trace positive, brown stool).   Musculoskeletal: Normal range of motion. She exhibits no edema or tenderness.   Neurological: She is alert and oriented to person, place, and time. Gait normal. GCS score is 15.   Skin: Skin is warm and dry. She is not diaphoretic.   Nursing note and vitals reviewed.        Diagnostic Study Results     Labs -     Results     Procedure Component Value Units Date/Time    Comprehensive Metabolic Panel (CMP) [[id number]] Collected:  07/19/14 1108    Specimen Information:  Blood Updated:  07/19/14 1131     Glucose 87 mg/dL      BUN 06.2 mg/dL      Creatinine 0.8 mg/dL      Sodium 376 mEq/L      Potassium 4.4 mEq/L      Chloride 110 mEq/L      CO2 23 mEq/L      CALCIUM 8.8 mg/dL      Protein, Total 6.2 g/dL      Albumin 4.0 g/dL      AST (SGOT) 18 U/L      ALT 9 U/L      Alkaline Phosphatase 47 U/L      Bilirubin, Total 0.5 mg/dL      Globulin 2.2 g/dL      Albumin/Globulin Ratio 1.8      Anion Gap 6.0     Lipase [[id number]] Collected:  07/19/14 1108    Specimen Information:  Blood Updated:  07/19/14 1131     Lipase 39 U/L     GFR [[id number]] Collected:  07/19/14 1108     EGFR >60.0 Updated:  07/19/14 1131    UA, Reflex to Microscopic [[id number]]  (Abnormal) Collected:  07/19/14 1108    Specimen Information:  Urine Updated:  07/19/14 1125     Urine Type Clean Catch      Color, UA Yellow      Clarity, UA Clear      Specific Gravity UA 1.010      Urine pH 6.0      Leukocyte Esterase, UA Negative  Nitrite, UA Negative      Protein, UR Negative      Glucose, UA Negative      Ketones UA Negative      Urobilinogen, UA Normal mg/dL      Bilirubin, UA Negative      Blood, UA Large (A)      RBC, UA TNTC (A) /hpf      WBC, UA 0-5 /hpf      Squamous Epithelial Cells, Urine 0-5 /hpf     Beta  HCG, Qual, Urine [[id number]] Collected:  07/19/14 1108    Specimen Information:  Urine Updated:  07/19/14 1124     Urine HCG Qualitative Negative     CBC and differential [[id number]]  (Abnormal) Collected:  07/19/14 1108    Specimen Information:  Blood / Blood Updated:  07/19/14 1114     WBC 7.56 x10 3/uL      RBC 4.63 x10 6/uL      Hgb 14.2 g/dL      Hematocrit 95.6 %      MCV 89.2 fL      MCH 30.7 pg      MCHC 34.4 g/dL      RDW 13 %      Platelets 269 x10 3/uL      MPV 9.2 (L) fL      Neutrophils 62 %      Lymphocytes Automated 25 %      Monocytes 10 %      Eosinophils Automated 3 %      Basophils Automated 0 %      Immature Granulocyte Unmeasured %      Neutrophils Absolute 4.67 x10 3/uL      Abs Lymph Automated 1.88 x10 3/uL      Abs Mono Automated 0.77 x10 3/uL      Abs Eos Automated 0.21 x10 3/uL      Absolute Baso Automated 0.03 x10 3/uL      Absolute Immature Granulocyte Unmeasured x10 3/uL           Radiologic Studies -   Radiology Results (24 Hour)     ** No results found for the last 24 hours. **      .      Medical Decision Making   I am the first provider for this patient.    I reviewed the vital signs, available nursing notes, past medical history, past surgical history, family history and social history.    Vital Signs-Reviewed the patient's vital signs.     No data found.      Pulse Oximetry Analysis - Normal 97% on RA    ED Course:   11:24 AM - Performed rectal exam that showed brown stools w/ trace blood. Pt denies pain or antiemetic meds    12:51 PM - Patient feels better and wants to go home. Patient is aware of all results. She is amenable for discharge. Discussed d/c instructions including outpatient f/u with PCP, Dr. Corrin Parker and GI, Dr.Kazemi. Advised pt to take the Rx (Zofran and Lomotil) as directed. Discussed return precautions. Pt verbalizes understanding and agrees with plan. All questions and concerns were addressed.     Provider Notes: DDx- colitis, AGE, allergic colitis, IBD  Pt presents  with diarrhea, notes several episodes of bloody diarrhea. Labs normal with normal H/H, abd CT showed no evidence of colitis, only enteritis. Pt well appearing, no comorbid conditions, no symptoms of lightheadedness or unstable vital signs, pt stable for outpt referral to  GI.         Diagnosis     Clinical Impression:   1. Enteritis    2. Pulmonary nodule        _______________________________    Attestations:  This note is prepared by Tammi Klippel, acting as scribe for French Ana,  MD    French Ana, MD. The scribe's documentation has been prepared under my direction and personally reviewed by me in its entirety.  I confirm that the note above accurately reflects all work, treatment, procedures, and medical decision making performed by me.         _______________________________          French Ana, MD  07/20/14 2150

## 2017-08-23 DIAGNOSIS — Z9289 Personal history of other medical treatment: Secondary | ICD-10-CM

## 2017-08-23 DIAGNOSIS — Z9889 Other specified postprocedural states: Secondary | ICD-10-CM

## 2017-08-23 HISTORY — PX: ANKLE SURGERY: SHX546

## 2017-08-23 HISTORY — DX: Other specified postprocedural states: Z98.890

## 2017-08-23 HISTORY — DX: Personal history of other medical treatment: Z92.89

## 2018-01-21 DIAGNOSIS — T148XXA Other injury of unspecified body region, initial encounter: Secondary | ICD-10-CM

## 2018-01-21 HISTORY — DX: Other injury of unspecified body region, initial encounter: T14.8XXA

## 2018-01-23 ENCOUNTER — Emergency Department: Payer: BLUE CROSS/BLUE SHIELD

## 2018-01-23 ENCOUNTER — Emergency Department
Admission: EM | Admit: 2018-01-23 | Discharge: 2018-01-24 | Disposition: A | Discharge status: Home / Self Care | Payer: BLUE CROSS/BLUE SHIELD | Attending: Emergency Medicine | Admitting: Emergency Medicine

## 2018-01-23 DIAGNOSIS — S82852A Displaced trimalleolar fracture of left lower leg, initial encounter for closed fracture: Secondary | ICD-10-CM | POA: Insufficient documentation

## 2018-01-23 DIAGNOSIS — J45909 Unspecified asthma, uncomplicated: Secondary | ICD-10-CM | POA: Insufficient documentation

## 2018-01-23 DIAGNOSIS — F17210 Nicotine dependence, cigarettes, uncomplicated: Secondary | ICD-10-CM | POA: Insufficient documentation

## 2018-01-23 DIAGNOSIS — E079 Disorder of thyroid, unspecified: Secondary | ICD-10-CM | POA: Insufficient documentation

## 2018-01-23 DIAGNOSIS — W109XXA Fall (on) (from) unspecified stairs and steps, initial encounter: Secondary | ICD-10-CM | POA: Insufficient documentation

## 2018-01-23 DIAGNOSIS — Z79899 Other long term (current) drug therapy: Secondary | ICD-10-CM | POA: Insufficient documentation

## 2018-01-23 DIAGNOSIS — S82892A Other fracture of left lower leg, initial encounter for closed fracture: Secondary | ICD-10-CM

## 2018-01-23 DIAGNOSIS — Z7951 Long term (current) use of inhaled steroids: Secondary | ICD-10-CM | POA: Insufficient documentation

## 2018-01-23 MED ORDER — MORPHINE SULFATE 4 MG/ML IJ/IV SOLN (WRAP)
4.0000 mg | Freq: Once | Status: AC
Start: 2018-01-23 — End: 2018-01-23
  Administered 2018-01-23: 4 mg via INTRAVENOUS
  Filled 2018-01-23: qty 1

## 2018-01-23 MED ORDER — ONDANSETRON HCL 4 MG/2ML IJ SOLN
4.0000 mg | Freq: Once | INTRAMUSCULAR | Status: AC
Start: 2018-01-23 — End: 2018-01-23
  Administered 2018-01-23: 4 mg via INTRAVENOUS
  Filled 2018-01-23: qty 2

## 2018-01-23 NOTE — ED Provider Notes (Signed)
[facility] EMERGENCY DEPARTMENT H&P      Visit date: 01/23/2018      CLINICAL SUMMARY          Diagnosis:    .     Final diagnoses:   Closed fracture of left ankle, initial encounter         MDM Notes:      Closed fracture, reduced by orthopedic. Stable vitals. No sedation performed.    Based on the patient's clinical presentation, vital signs, and diagnostic studies, the patient is safe for discharge. The work up has shown no signs of life-threatening emergency. The patient understands that follow up is needed, and if unable to do this - the patient should return to the ER for a repeat evaluation.    The patient/family understands their instructions and the patient is well-appearing at time of discharge. I explained results and discharge instructions to the patient/family. The patient/family acknowledges understanding and agrees with plan.           Disposition:         Discharge         Discharge Prescriptions     Medication Sig Dispense Auth. Provider    oxyCODONE-acetaminophen (PERCOCET) 5-325 MG per tablet Take 1 tablet by mouth every 4 (four) hours as needed for Pain 12 tablet Dot Been, MD                      CLINICAL INFORMATION        HPI:      Chief Complaint: Ankle Injury  .    Anna Lamb is a 47 y.o. female BIBA who presents with left ankle pain after fall today. Pt was walking down the stairs and fell backwards into a wall and twisted her ankle while doing so. Pt also complains of back pain. Unsure if she hit her head. Denies LOC. Denies vomiting. Pt was given fentanyl en route and complains of nausea secondary to taking fentanyl.     History obtained from: patient, family          ROS:      Positive and negative ROS elements as per HPI.  All other systems reviewed and negative.      Physical Exam:      Pulse 91  BP 116/69  Resp 15  SpO2 98 %  Temp 97.7 F (36.5 C)    Constitutional: Vital signs reviewed. Well appearing.  Head: Normocephalic,  atraumatic  Eyes: No conjunctival injection. No discharge. PERRL, EOMI  ENT: Mucous membranes moist, clear oropharynx  Neck: Normal range of motion. Trachea midline.  Respiratory/Chest: clear to auscultation. No wheezes, rales or rhonchi. Good air movement.  No dyspnea or work of breathing.  Cardiovascular: Regular rate and rhythm. No murmur. No rubs, murmurs, gallops  Abdomen: soft and nontender in all quadrants. No guarding or rebound. No masses or hepatosplenomegaly   Back: no CVA tenderness, No cervical, thoracic, or lumbar spinal tenderness  Upper Extremities:No edema. No cyanosis. No deformity.  Lower Extremities: No edema. No cyanosis. Left ankle deformity with significant TTP to medial aspect proximal to malleolus. Left DP 2+. Cap Refill <2 seconds.  Neurological: Alert Normal and symmetric motor tone by observation. Speech normal. Memory Normal. No facial assymetry.  Skin: Warm and dry. No rash.  Psychiatric: Normal affect. Normal concentration.            PAST HISTORY        Primary Care Provider: No primary  care provider on file.        PMH/PSH:    .     Past Medical History:   Diagnosis Date   . Anxiety    . Asthma    . Depression    . Disorder of thyroid    . Migraine        She has a past surgical history that includes Thyroid surgery.          Social/Family History:      She reports that she has been smoking Cigarettes.  She has a 12.50 pack-year smoking history. She has never used smokeless tobacco. She reports that she does not drink alcohol. Her drug history is not on file.    History reviewed. No pertinent family history.      Listed Medications on Arrival:    .     Home Medications     Med List Status:  In Progress Set By: Myrtie Hawk, RN at 01/23/2018 10:03 PM                ADVAIR DISKUS 250-50 MCG/DOSE Aerosol Powder, Breath Activtivatede          diphenoxylate-atropine (LOMOTIL) 2.5-0.025 MG per tablet     Take 1 tablet by mouth 4 (four) times daily as needed for Diarrhea.     DULoxetine  (CYMBALTA) 60 MG capsule          meloxicam (MOBIC) 15 MG tablet          ondansetron (ZOFRAN ODT) 4 MG disintegrating tablet     Take 1 tablet (4 mg total) by mouth every 6 (six) hours as needed for Nausea.     SYNTHROID 125 MCG tablet              Allergies: She is allergic to penicillin v potassium and sulfa antibiotics.            VISIT INFORMATION        Clinical Course in the ED:       11:15 PM  Ortho aware, will come to see patient.      Medications Given in the ED:    .     ED Medication Orders     Start Ordered     Status Ordering Provider    01/24/18 0441 01/24/18 0440  ondansetron (ZOFRAN) injection 4 mg  Once     Route: Intravenous  Ordered Dose: 4 mg     Ordered Alisyn Lequire B    01/24/18 0326 01/24/18 0326  HYDROmorphone (DILAUDID) injection 2 mg  Once     Route: Intravenous  Ordered Dose: 2 mg     Last MAR action:  Given Mazey Mantell B    01/24/18 0136 01/24/18 0136  propofol (DIPRIVAN) injection 150 mg  Once     Comments:  With sedation   Route: Intravenous  Ordered Dose: 150 mg     Last MAR action:  Not Given Gracelyn Nurse B    01/24/18 0032 01/24/18 0031  HYDROmorphone (DILAUDID) injection 1 mg  Once     Route: Intravenous  Ordered Dose: 1 mg     Last MAR action:  Given Charmane Protzman B    01/23/18 2223 01/23/18 2222  ondansetron (ZOFRAN) injection 4 mg  Once     Route: Intravenous  Ordered Dose: 4 mg     Last MAR action:  Given Gracelyn Nurse B    01/23/18 2218 01/23/18 2218  morphine injection 4 mg  Once  Route: Intravenous  Ordered Dose: 4 mg     Last MAR action:  Given Dorethia Jeanmarie B            Procedures:      Procedures      Interpretations:           Differential Diagnosis considered by MD (not completely inclusive): fracture, dislocation, contusion, bursitis, hematoma, tendon rupture, strain/sprain, DVT, superficial thrombophlebitis    Each of the differential diagnosis has been considered for diagnosis and weighed risk and benefit of further testing and evaluation in the context of patient  complaint. Some diagnosis do not warrant further testing including imaging and/or labs. However, all differential diagnosis have been considered.    Pulse Ox Analysis: Yes: saturation: 98 %; Oxygen use: room air; Interpretation: Normal    Radiologic study results reviewed by me: Yes   Radiologic Studies Interpreted (viewed) by me: Yes    All tests, tracings, EKGs, vital signs and radiological studies above where applicable were interpreted by me, Gracelyn Nurse MD.              RESULTS        Lab Results:      Results     Procedure Component Value Units Date/Time    Glucose Whole Blood - POCT [[id number]]  (Abnormal) Collected:  01/24/18 0436     Updated:  01/24/18 0438     POCT - Glucose Whole blood 116 (H) mg/dL     Basic Metabolic Panel [[id number]]  (Abnormal) Collected:  01/24/18 0112    Specimen:  Blood Updated:  01/24/18 0145     Glucose 101 (H) mg/dL      BUN 86.5 mg/dL      Creatinine 0.7 mg/dL      Calcium 9.4 mg/dL      Sodium 784 mEq/L      Potassium 4.2 mEq/L      Chloride 108 mEq/L      CO2 18 (L) mEq/L     GFR [[id number]] Collected:  01/24/18 0112     Updated:  01/24/18 0145     EGFR >60.0    CBC with differential [[id number]] Collected:  01/24/18 0112    Specimen:  Blood from Blood Updated:  01/24/18 0135     WBC 8.64 x10 3/uL      Hgb 12.6 g/dL      Hematocrit 72.5 %      Platelets 242 x10 3/uL      RBC 4.24 x10 6/uL      MCV 92.5 fL      MCH 29.7 pg      MCHC 32.1 g/dL      RDW 12 %      MPV 9.5 fL      Neutrophils 73.2 %      Lymphocytes Automated 19.9 %      Monocytes 6.0 %      Eosinophils Automated 0.3 %      Basophils Automated 0.3 %      Immature Granulocyte 0.3 %      Nucleated RBC 0.0 /100 WBC      Neutrophils Absolute 6.31 x10 3/uL      Abs Lymph Automated 1.72 x10 3/uL      Abs Mono Automated 0.52 x10 3/uL      Abs Eos Automated 0.03 x10 3/uL      Absolute Baso Automated 0.03 x10 3/uL      Absolute Immature Granulocyte 0.03 x10 3/uL  Absolute NRBC 0.00 x10 3/uL               Radiology  Results:      XR Ankle Left 3+ Views   Final Result      Trimalleolar ankle fracture in near anatomic alignment. No dislocation.       Johnsie Kindred, MD    01/24/2018 4:55 AM      XR Ankle Left 3+ Views   Final Result       Interval reduction of fracture dislocation of the ankle.      Johnsie Kindred, MD    01/24/2018 4:07 AM      XR Ankle Left AP And Lateral   Final Result    Fracture dislocation of the left ankle      Genelle Bal, MD    01/23/2018 10:50 PM                  Scribe Attestation:      I was acting as a scribe for Dot Been, MD on Anna Lamb  Treatment Team: Scribe: Rolland Porter     I am the first provider for this patient and I personally performed the services documented. Treatment Team: Scribe: [place], [place] is scribing for me on Anna Lamb,Anna M. This note and the patient instructions accurately reflect work and decisions made by me.  Dot Been, MD    *This note was generated by the Epic EMR system/ Dragon speech recognition and may contain inherent errors or omissions not intended by the user. Grammatical errors, random word insertions, deletions, pronoun errors and incomplete sentences are occasional consequences of this technology due to software limitations. Not all errors are caught or corrected. If there are questions or concerns about the content of this note or information contained within the body of this dictation they should be addressed directly with the author for clarification.Dot Been, MD  01/25/18 [id number]

## 2018-01-24 ENCOUNTER — Emergency Department: Payer: BLUE CROSS/BLUE SHIELD

## 2018-01-24 LAB — CBC AND DIFFERENTIAL
Absolute NRBC: 0 10*3/uL (ref 0.00–0.00)
Basophils Absolute Automated: 0.03 10*3/uL (ref 0.00–0.08)
Basophils Automated: 0.3 %
Eosinophils Absolute Automated: 0.03 10*3/uL (ref 0.00–0.44)
Eosinophils Automated: 0.3 %
Hematocrit: 39.2 % (ref 34.7–43.7)
Hgb: 12.6 g/dL (ref 11.4–14.8)
Immature Granulocytes Absolute: 0.03 10*3/uL (ref 0.00–0.07)
Immature Granulocytes: 0.3 %
Lymphocytes Absolute Automated: 1.72 10*3/uL (ref 0.42–3.22)
Lymphocytes Automated: 19.9 %
MCH: 29.7 pg (ref 25.1–33.5)
MCHC: 32.1 g/dL (ref 31.5–35.8)
MCV: 92.5 fL (ref 78.0–96.0)
MPV: 9.5 fL (ref 8.9–12.5)
Monocytes Absolute Automated: 0.52 10*3/uL (ref 0.21–0.85)
Monocytes: 6 %
Neutrophils Absolute: 6.31 10*3/uL (ref 1.10–6.33)
Neutrophils: 73.2 %
Nucleated RBC: 0 /100 WBC (ref 0.0–0.0)
Platelets: 242 10*3/uL (ref 142–346)
RBC: 4.24 10*6/uL (ref 3.90–5.10)
RDW: 12 % (ref 11–15)
WBC: 8.64 10*3/uL (ref 3.10–9.50)

## 2018-01-24 LAB — BASIC METABOLIC PANEL
BUN: 13 mg/dL (ref 7.0–19.0)
CO2: 18 mEq/L — ABNORMAL LOW (ref 22–29)
Calcium: 9.4 mg/dL (ref 8.5–10.5)
Chloride: 108 mEq/L (ref 100–111)
Creatinine: 0.7 mg/dL (ref 0.6–1.0)
Glucose: 101 mg/dL — ABNORMAL HIGH (ref 70–100)
Potassium: 4.2 mEq/L (ref 3.5–5.1)
Sodium: 138 mEq/L (ref 136–145)

## 2018-01-24 LAB — GFR: EGFR: >60

## 2018-01-24 LAB — GLUCOSE WHOLE BLOOD - POCT: Whole Blood Glucose POCT: 116 mg/dL — ABNORMAL HIGH (ref 70–100)

## 2018-01-24 MED ORDER — HYDROMORPHONE HCL 1 MG/ML IJ SOLN
2.0000 mg | Freq: Once | INTRAMUSCULAR | Status: AC
Start: 2018-01-24 — End: 2018-01-24
  Administered 2018-01-24: 2 mg via INTRAVENOUS
  Filled 2018-01-24: qty 2

## 2018-01-24 MED ORDER — PROPOFOL 10 MG/ML IV EMUL (WRAP)
150.0000 mg | Freq: Once | INTRAVENOUS | Status: DC
Start: 2018-01-24 — End: 2018-01-24

## 2018-01-24 MED ORDER — OXYCODONE-ACETAMINOPHEN 5-325 MG PO TABS
1.0000 | ORAL_TABLET | ORAL | 0 refills | Status: DC | PRN
Start: 2018-01-24 — End: 2018-01-24

## 2018-01-24 MED ORDER — HYDROMORPHONE HCL 1 MG/ML IJ SOLN
1.0000 mg | Freq: Once | INTRAMUSCULAR | Status: AC
Start: 2018-01-24 — End: 2018-01-24
  Administered 2018-01-24: 1 mg via INTRAVENOUS
  Filled 2018-01-24: qty 1

## 2018-01-24 MED ORDER — ONDANSETRON HCL 4 MG/2ML IJ SOLN
4.0000 mg | Freq: Once | INTRAMUSCULAR | Status: DC
Start: 2018-01-24 — End: 2018-01-24
  Filled 2018-01-24: qty 2

## 2018-01-24 MED ORDER — HYDROCODONE-ACETAMINOPHEN 5-325 MG PO TABS
1.0000 | ORAL_TABLET | Freq: Four times a day (QID) | ORAL | 0 refills | Status: AC | PRN
Start: 2018-01-24 — End: 2018-01-31

## 2018-01-24 NOTE — ED Notes (Signed)
Went to check on pt who was in the bathroom having just vomited.  Pt was clammy and stated she felt very hot and sick.  Pt's vitals were WNL and BG was WNL.  MD Imogene Burn at bedside to assess.

## 2018-01-24 NOTE — Consults (Signed)
Orthopaedic Consult    Date Time: 01/24/18 3:03 AM  Patient Name: Anna Nigh, Anna Lamb Attending Physician    Time first seen by orthopaedics [id number]        Assessment & Plan  Orthopaedic assessment:  Anna Lamb 47 y.o. female with a left  dislocated bimaleolar ankle fracture.       Reductions/Procedures/Splinting performed (indicate type of Anesthesia used):  Spoke to patient about need for reduction of the injured extremity. I explained that the benefits of a reduction include improved bony alignment, decreased soft tissue swelling, and pain relief. Risks include but are not limited to nerve, vascular, and soft tissue injury. Questions were answered and verbal consent was obtained. Then a hematoma block and closed reduction was performed.      Plan:   1. Non-weight bearing  left lower extremity  2. Pain control  3. Post-reduction XRs  4. Follow Up in ortho clinic in one week    Wandalee Ferdinand, Anna Lamb PGY4  Dept of Orthopedics  Page [phone] or call [phone] for questions       HPI    Anna Lamb is a 47 y.o. year old female.  Orthopaedic consultation has specifically been requested to address this patient's current musculoskeletal presentation. She fell down several stairs and felt immediate severe pain in her left ankle. The pain was constant and worse with movement and better with rest. She could not ambulate after the injury and denies numbness or tingling to her foot and toes. There is no pain in her opposite leg.         Past Medical and Surgical History      Past Medical History:   Diagnosis Date   . Anxiety    . Asthma    . Depression    . Disorder of thyroid    . Migraine         Past Surgical History:   Procedure Laterality Date   . THYROID SURGERY         Past Social History & Family History   Social History:   Social History     Social History   . Marital status: Single     Spouse name: N/A   . Number of children: N/A   . Years of education: N/A     Social History Main Topics   .  Smoking status: Current Every Day Smoker     Packs/day: 0.50     Years: 25.00     Types: Cigarettes   . Smokeless tobacco: Never Used   . Alcohol use No   . Drug use: Unknown   . Sexual activity: Not on file     Other Topics Concern   . Not on file     Social History Narrative   . No narrative on file       Family History: History reviewed. No pertinent family history.    Relevant Family & Social  history reviewed.  Any findings relevant to current orthopaedic presentation noted in HPI, otherwise the remainder are noncontributory.      Review of Systems:   MSK noted as above.   GI: No current Nausea/vomiting  ENT: Denies sore throat, epistaxis  CV: Denies chest pain   Resp: No current shortness of breath    Other than mentioned above, there are no Constitutional, Neurological, Psychiatric, ENT, Ophthalmological, Cardiovascular, Respiratory, GI, GU, Musculoskeletal, Integumentary, Lymphatic, Endocrine or Allergic issues.  Home Medications     Prior to Admission medications    Medication Sig Start Date End Date Taking? Authorizing Provider   ADVAIR DISKUS 250-50 MCG/DOSE Aerosol Powder, Breath Activtivatede  07/17/14   Provider, Historical, Anna Lamb   diphenoxylate-atropine (LOMOTIL) 2.5-0.025 MG per tablet Take 1 tablet by mouth 4 (four) times daily as needed for Diarrhea. 07/19/14   French Ana, Anna Lamb   DULoxetine (CYMBALTA) 60 MG capsule  05/30/14   Provider, Historical, Anna Lamb   meloxicam (MOBIC) 15 MG tablet  04/12/14   Provider, Historical, Anna Lamb   ondansetron (ZOFRAN ODT) 4 MG disintegrating tablet Take 1 tablet (4 mg total) by mouth every 6 (six) hours as needed for Nausea. 07/19/14   French Ana, Anna Lamb   SYNTHROID 125 MCG tablet  07/17/14   Provider, Historical, Anna Lamb       Allergies     Allergies   Allergen Reactions   . Penicillin V Potassium Hives   . Sulfa Antibiotics Hives       Radiology Studies: (actual Orthopaedically relevant films reviewed and read by Orthopaedics)   Left dislocated bimaleolar ankle  fracture                             Physical Exam:     Patient is a 47 y.o. year old female who is alert, well appearing, and in no distress mood is alert.  Orientation: fully oriented    BP (!) 143/92   Pulse 90   Temp 97.7 F (36.5 C) (Oral)   Resp 15   Ht 1.676 m (5\' 6" )   Wt 86.2 kg (190 lb)   SpO2 97%   BMI 30.67 kg/m   86.2 kg (190 lb)   1.676 m (5\' 6" )    Gait: unable to ambulate    Heart: regular rate  Lungs: unlabored breathing        Right Upper Extremity:   Inspection: No swelling, deformity or atrophy noted.   Palpation:  No tenderness  ROM:  Normal range of motion  Joint Stability:normal  Strength: normal  Skin: normal   Peripheral Vascular: normal  Reflexes: normal reflexes  Sensation: sensation intact to light touch  Lymph Nodes: None Palpable  Coordination: normal coordination      Left Lower Extremity:   Inspection:  Swelling and deformity noted of ankle  Palpation:  Tenderness- at fracture site  ROM:  Limited secondary to pain  Joint Stability: decreased stability of ankle joint  Strength: limited secondary to pain  Skin: swollen  Peripheral Vascular: palpable DP and PT  Reflexes: normal  Sensation: intact in SP/DP/T distributions  Lymph Nodes: None Palpable  Coordination:: normal   Left Upper Extremity:   Inspection: No swelling, deformity or atrophy noted.   Palpation:  No tenderness  ROM:  Normal range of motion  Joint Stability:normal  Strength: normal  Skin: normal   Peripheral Vascular: normal  Reflexes: normal reflexes  Sensation: sensation intact to light touch  Lymph Nodes: None Palpable  Coordination: normal coordination      Right Lower Extremity:   Inspection:  No swelling, erythema, deformity, atrophy or hypertrophy noted  Palpation:  Tenderness-non-tender  ROM:  Normal range of motion  Joint Stability: normal  Strength: normal strength  Skin: normal   Peripheral Vascular: normal  Reflexes: normal  Sensation:: normal  Lymph Nodes: None Palpable  Coordination: normal      Pelvis:   Skin:normal  Palpation: no tenderness noted  Stability: normal stability

## 2018-01-24 NOTE — ED Notes (Signed)
Ortho at bedside splinting LLE

## 2018-01-24 NOTE — ED Notes (Signed)
Discussed d/c instructions with pt until she had no additional follow up questions.  Pt was able to ambulate to the lobby unassisted and without incident.

## 2018-01-24 NOTE — Discharge Instructions (Signed)
Dear Mr. Larock:    Thank you for choosing the [facility] Emergency Department, the premier emergency department in the [place] area.  I hope your visit today was EXCELLENT.    Specific instructions for your visit today:      Ankle Fracture    You have a fracture of the ankle.    Three bones come together to make the ankle joint. They are the tibia, the fibula and the talus. The talus is the upper most bone of the foot.   You have fractured both the tibia and the fibula bones. The fibula is the smaller bone of the lower leg. It is on the same side of the ankle as the little toe. The tibia is the larger bone of the lower leg. It is on the big toe side of the ankle. This injury often happens when the ankle is twisted strongly. This tears ankle ligaments. It also causes a break in the bone the ligaments hold together.    A fracture is a break in a bone. It means the same thing as saying a "broken bone." Usually, fractures heal in about 6-8 weeks. The place that is broken will eventually become stronger than the area around it. Fractures are often treated with a splint or cast. A splint keeps the injured area stable. However, the orthopedic (bone) doctor will probably change it to a cast. Most fractures can be managed with a splint or cast. Some need surgery for the best alignment and fracture correction. The orthopedic doctor will help decide this.    Some ankle fractures require immediate surgery. Your fracture is stable enough for you to go home. You will need a follow up with a referral doctor.    Do not put any weight on the injured ankle. Use crutches to help get around.    Generally, fracture treatment includes the use of pain medicine and a splint/cast to reduce movement. Treatment also involves Resting, Icing, Compressing and Elevating the injured area. Remember this as "RICE."   REST: Limit the use of the injured body part.   ICE: By applying ice to the affected area, swelling and pain can be  reduced. Place some ice cubes in a re-sealable (Ziploc) bag and add some water. Put a thin washcloth between the bag and the skin. Apply the ice bag to the area for at least 20 minutes. Do this at least 4 times per day. It is okay to do this more often than directed. You can also do it for longer than directed. NEVER APPLY ICE DIRECTLY TO THE SKIN.   COMPRESS: Compression means to apply pressure around the injured area such as with a splint, cast or an ACE bandage. Compression decreases swelling and improves comfort. Compression should be tight enough to relieve swelling but not so tight as to decrease circulation. Increasing pain, numbness, tingling, or change in skin color, are all signs of decreased circulation.   ELEVATE: Elevate the injured part. For example, a leg can be elevated by placing the leg on a chair while sitting or by propping it up on pillows while lying down.    You have a splint for your fracture. This is to reduce pain and keep the injured area immobilized (still). Use the splint until you follow up with an orthopedic (bone) doctor.    Use the following SPLINT CARE instructions. Do the following many times throughout the day:   Check capillary refill (circulation) in the nail beds. Press on the  nail bed and then release. The nail bed should turn white. It should then go pink again in less than 2 seconds.   Watch to see if the area beyond the splint gets swollen.   Sometimes the splint is too tight. When this happens, the skin of the hand, foot, fingers or toes is very cold, pale or numb to the touch. The wrap holding the splint in place can be loosened. You can come back here or go to the nearest Emergency Department to have it adjusted.    YOU SHOULD SEEK MEDICAL ATTENTION IMMEDIATELY, EITHER HERE OR AT THE NEAREST EMERGENCY DEPARTMENT, IF ANY OF THE FOLLOWING OCCURS:   You have a severe increase in pain or swelling in the injured area.   You have new numbness or tingling in or  below the injured area.   Your foot gets cold and pale. This could mean that the foot has a problem with its blood supply.                 If you do not continue to improve or your condition worsens, please contact your doctor or return immediately to the Emergency Department.    Sincerely,  Dot Been, MD  Attending Emergency Physician  [facility] Emergency Department    ONSITE PHARMACY  Our full service onsite pharmacy is located in the ER waiting room.  Open 7 days a week from 9 am to 9 pm.  We accept all major insurances and prices are competitive with major retailers.  Ask your provider to print your prescriptions down to the pharmacy to speed you on your way home.    OBTAINING A PRIMARY CARE APPOINTMENT    Primary care physicians (PCPs, also known as primary care doctors) are either internists or family medicine doctors. Both types of PCPs focus on health promotion, disease prevention, patient education and counseling, and treatment of acute and chronic medical conditions.    Call for an appointment with a primary care doctor.  Ask to see who is taking new patients.     [facility]  telephone:  [phone]  [email or web address]    DOCTOR REFERRALS  Call [phone] (available 24 hours a day, 7 days a week) if you need any further referrals and we can help you find a primary care doctor or specialist.  Also, available online at:  [email or web address]    YOUR CONTACT INFORMATION  Before leaving please check with registration to make sure we have an up-to-date contact number.  You can call registration at [phone] to update your information.  For questions about your hospital bill, please call [phone].  For questions about your Emergency Dept Physician bill please call [phone].      FREE HEALTH SERVICES  If you need help with health or social services, please call 2-1-1 for a free referral to resources in your area.  2-1-1 is a free service connecting  people with information on health insurance, free clinics, pregnancy, mental health, dental care, food assistance, housing, and substance abuse counseling.  Also, available online at:  http://www.[facility].org    MEDICAL RECORDS AND TESTS  Certain laboratory test results do not come back the same day, for example urine cultures.   We will contact you if other important findings are noted.  Radiology films are often reviewed again to ensure accuracy.  If there is any discrepancy, we will notify you.      Please call [phone] to pick up a complimentary CD of  any radiology studies performed.  If you or your doctor would like to request a copy of your medical records, please call [phone].      ORTHOPEDIC INJURY   Please know that significant injuries can exist even when an initial x-ray is read as normal or negative.  This can occur because some fractures (broken bones) are not initially visible on x-rays.  For this reason, close outpatient follow-up with your primary care doctor or bone specialist (orthopedist) is required.    MEDICATIONS AND FOLLOWUP  Please be aware that some prescription medications can cause drowsiness.  Use caution when driving or operating machinery.    The examination and treatment you have received in our Emergency Department is provided on an emergency basis, and is not intended to be a substitute for your primary care physician.  It is important that your doctor checks you again and that you report any new or remaining problems at that time.      24 HOUR PHARMACIES  The nearest 24 hour pharmacy is:    [facility]  [address]  [place], [place] [address]  [phone]      ASSISTANCE WITH INSURANCE    Affordable Care Act  [facility])  Call to start or finish an application, compare plans, enroll or ask a question.  [phone]  TTY: [phone]  Web:  Healthcare.gov    Help Enrolling in [facility]  [employer] [place]  [phone] (TOLL-FREE)  [phone]  (TTY)  Web:  Http://www.coverva.org    Local Help Enrolling in the [facility]  [place] [facility]  [phone] (MAIN)  Email:  health-help@nvfs .org  Web:  BlackjackMyths.is  Address:  [address], [address] [place], [place] [address]    SEDATING MEDICATIONS  Sedating medications include strong pain medications (e.g. narcotics), muscle relaxers, benzodiazepines (used for anxiety and as muscle relaxers), Benadryl/diphenhydramine and other antihistamines for allergic reactions/itching, and other medications.  If you are unsure if you have received a sedating medication, please ask your physician or nurse.  If you received a sedating medication: DO NOT drive a car. DO NOT operate machinery. DO NOT perform jobs where you need to be alert.  DO NOT drink alcoholic beverages while taking this medicine.     If you get dizzy, sit or lie down at the first signs. Be careful going up and down stairs.  Be extra careful to prevent falls.     Never give this medicine to others.     Keep this medicine out of reach of children.     Do not take or save old medicines. Throw them away when outdated.     Keep all medicines in a cool, dry place. DO NOT keep them in your bathroom medicine cabinet or in a cabinet above the stove.    MEDICATION REFILLS  Please be aware that we cannot refill any prescriptions through the ER. If you need further treatment from what is provided at your ER visit, please follow up with your primary care doctor or your pain management specialist.    FREESTANDING EMERGENCY DEPARTMENTS OF [facility]  Did you know [facility] has two freestanding ERs located just a few miles away?  [facility] [place] and [facility] [place] have short wait times, easy free parking directly in front of the building and top patient satisfaction scores - and the same Board Certified Emergency Medicine doctors as [facility].

## 2018-01-25 ENCOUNTER — Ambulatory Visit (INDEPENDENT_AMBULATORY_CARE_PROVIDER_SITE_OTHER): Payer: BLUE CROSS/BLUE SHIELD | Admitting: Orthopaedic Surgery

## 2018-01-25 ENCOUNTER — Encounter (INDEPENDENT_AMBULATORY_CARE_PROVIDER_SITE_OTHER): Payer: Self-pay | Admitting: Orthopaedic Surgery

## 2018-01-25 DIAGNOSIS — S9305XA Dislocation of left ankle joint, initial encounter: Secondary | ICD-10-CM

## 2018-01-25 MED ORDER — HYDROCODONE-ACETAMINOPHEN 5-325 MG PO TABS
1.0000 | ORAL_TABLET | Freq: Four times a day (QID) | ORAL | 0 refills | Status: DC | PRN
Start: 2018-01-25 — End: 2018-02-01

## 2018-01-25 NOTE — Progress Notes (Signed)
Orthopaedic Trauma New Patient Visit    Chief Complaint   Patient presents with   . Ankle Injury     2 days S/P Fracture dislocation of the left ankle DOI 01/23/2018       HPI: Anna Lamb is a 47 y.o. female.  She presents for examination of her left ankle.  She sustained this fracture while walking downstairs sustaining a fall.  She landed backwards into a wall twisted her ankle while doing so.  Patient also states that she fell twice in the lobby downstairs and almost fell in the office this morning.  She is using a walker.  The staff witnessed her walking using the walker correctly and gave her some corrective instructions and demonstrations.    ROS: Denies fevers, chills, chest pain, shortness of breath, nausea, vomiting, or diarrhea.    Patient History:  Patient Active Problem List    Diagnosis Date Noted   . Dislocation of left ankle joint 01/25/2018       Current Outpatient Prescriptions   Medication Sig Dispense Refill   . DULoxetine (CYMBALTA) 60 MG capsule      . HYDROcodone-acetaminophen (NORCO) 5-325 MG per tablet Take 1 tablet by mouth every 6 (six) hours as needed for Pain 12 tablet 0   . SYNTHROID 125 MCG tablet      . ondansetron (ZOFRAN ODT) 4 MG disintegrating tablet Take 1 tablet (4 mg total) by mouth every 6 (six) hours as needed for Nausea. 8 tablet 0     No current facility-administered medications for this visit.      .  Allergies: Penicillin v potassium and Sulfa antibiotics  Past Medical History:   Diagnosis Date   . Anxiety    . Asthma    . Depression    . Disorder of thyroid    . Migraine      Past Surgical History:   Procedure Laterality Date   . THYROID SURGERY       Family History   Problem Relation Age of Onset   . No known problems Mother    . No known problems Father      Social History   Substance Use Topics   . Smoking status: Current Every Day Smoker     Packs/day: 0.50     Years: 25.00     Types: Cigarettes   . Smokeless tobacco: Never Used   . Alcohol use No       In  addition to findings reviewed in EPIC, relevant Medical/Family/Social History noted in today's HPI, otherwise are noncontributory to today's visit.    Physical Exam: Pt is a well developed, well nourished 47 y.o. year old female who is awake, alert, and oriented.  The patient has a normal affect and is in no acute distress.  Vitals:    01/25/18 1425   BP: 140/83   Pulse: (!) 103   Resp: 16       Gait: Gait cannot be assessed as the patient is nonweightbearing on her left lower extremity.    Patient has a fairly labile affect.  She is intermittently crying and laughing.  Her splint was not removed secondary to severe discomfort.  Toes show good capillary refill in the a warm.  She is able to flex and extend them.    For comparison, their contralateral extremity is normal on exam with no pain nor crepitus.    Radiology:     Xrays taken in clinic today: View of previous radiographs   demonstrates a trimalleolar ankle fracture on the left.    Impression: Unstable trimalleolar ankle fracture on the left.    Plan: Operative internal fixation next week after the swelling has diminished.    The patient was counseled as to the indications, goals, options, alternative, risks, benefits and complications of surgical intervention, including, but not limited to: infection, bleeding, pain, failure of fixation, non/mal-union, gait issues, failure to recover, dislocation, pain, stiffness, weakness, injury to local structures, need for additional surgery, wound problems,as well as medical and  anesthesia complications, including death, MI, CVA, VTE, pneumonia, and UTI, among others. They understand and wish to proceed. In addition, the anticipated healing and rehabilitative course was discussed. All questions were answered.    [name] A. Gaelan Glennon, MD  Orthopaedic Trauma Surgeon  [facility]        The review of the patient's medications does not in any way constitute an endorsement, by this clinician,  of their use,  dosage, indications, route, efficacy, interactions, or other clinical parameters.    This note was generated within the EPIC EMR using Dragon medical speech recognition software and may contain inherent errors or omissions not intended by the user. Grammatical and punctuation errors, random word insertions, deletions, pronoun errors and incomplete sentences are occasional consequences of this technology due to software limitations. Not all errors are caught or corrected.  Although every attempt is made to root out erroneus and incomplete transcription, the note may still not fully represent the intent or opinion of the author. If there are questions or concerns about the content of this note or information contained within the body of this dictation they should be addressed directly with the author for clarification.

## 2018-01-26 NOTE — Pre-Procedure Instructions (Signed)
   Email sent to [place] in surg office, Celebrex not entered due to allergy alert.

## 2018-02-01 ENCOUNTER — Telehealth: Payer: BLUE CROSS/BLUE SHIELD

## 2018-02-01 NOTE — Pre-Procedure Instructions (Signed)
   No pre op testing requested/required

## 2018-02-02 ENCOUNTER — Encounter: Payer: Self-pay | Admitting: Orthopaedic Surgery

## 2018-02-02 DIAGNOSIS — S82852A Displaced trimalleolar fracture of left lower leg, initial encounter for closed fracture: Secondary | ICD-10-CM

## 2018-02-03 ENCOUNTER — Ambulatory Visit: Payer: BLUE CROSS/BLUE SHIELD | Admitting: Registered Nurse

## 2018-02-03 ENCOUNTER — Encounter
Admission: RE | Disposition: A | Discharge status: Home / Self Care | Payer: Self-pay | Source: Ambulatory Visit | Attending: Orthopaedic Surgery

## 2018-02-03 ENCOUNTER — Ambulatory Visit: Payer: BLUE CROSS/BLUE SHIELD

## 2018-02-03 ENCOUNTER — Ambulatory Visit
Admission: RE | Admit: 2018-02-03 | Discharge: 2018-02-03 | Disposition: A | Discharge status: Home / Self Care | Payer: BLUE CROSS/BLUE SHIELD | Source: Ambulatory Visit | Attending: Orthopaedic Surgery | Admitting: Orthopaedic Surgery

## 2018-02-03 DIAGNOSIS — F17210 Nicotine dependence, cigarettes, uncomplicated: Secondary | ICD-10-CM | POA: Insufficient documentation

## 2018-02-03 DIAGNOSIS — S82852A Displaced trimalleolar fracture of left lower leg, initial encounter for closed fracture: Secondary | ICD-10-CM

## 2018-02-03 DIAGNOSIS — E079 Disorder of thyroid, unspecified: Secondary | ICD-10-CM | POA: Insufficient documentation

## 2018-02-03 DIAGNOSIS — J45909 Unspecified asthma, uncomplicated: Secondary | ICD-10-CM | POA: Insufficient documentation

## 2018-02-03 DIAGNOSIS — W109XXA Fall (on) (from) unspecified stairs and steps, initial encounter: Secondary | ICD-10-CM | POA: Insufficient documentation

## 2018-02-03 HISTORY — PX: ORIF, ANKLE, TRIMALLEOLAR: SHX5746

## 2018-02-03 HISTORY — DX: Nausea with vomiting, unspecified: R11.2

## 2018-02-03 HISTORY — DX: Hypothyroidism, unspecified: E03.9

## 2018-02-03 SURGERY — ORIF, ANKLE, TRIMALLEOLAR
Anesthesia: Anesthesia General | Site: Ankle | Laterality: Left | Wound class: Clean

## 2018-02-03 MED ORDER — PROPOFOL 10 MG/ML IV EMUL (WRAP)
INTRAVENOUS | Status: DC | PRN
Start: 2018-02-03 — End: 2018-02-03
  Administered 2018-02-03 (×2): 200 mg via INTRAVENOUS

## 2018-02-03 MED ORDER — PROPOFOL 10 MG/ML IV EMUL (WRAP)
INTRAVENOUS | Status: AC
Start: 2018-02-03 — End: ?
  Filled 2018-02-03: qty 40

## 2018-02-03 MED ORDER — MORPHINE SULFATE ER 15 MG PO TBCR
15.0000 mg | EXTENDED_RELEASE_TABLET | Freq: Once | ORAL | Status: AC
Start: 2018-02-03 — End: 2018-02-03
  Administered 2018-02-03: 10:00:00 15 mg via ORAL

## 2018-02-03 MED ORDER — OXYCODONE-ACETAMINOPHEN 5-325 MG PO TABS
1.0000 | ORAL_TABLET | Freq: Once | ORAL | Status: DC | PRN
Start: 2018-02-03 — End: 2018-02-03

## 2018-02-03 MED ORDER — ACETAMINOPHEN 500 MG PO TABS
1000.0000 mg | ORAL_TABLET | Freq: Once | ORAL | Status: AC
Start: 2018-02-03 — End: 2018-02-03
  Administered 2018-02-03: 10:00:00 1000 mg via ORAL

## 2018-02-03 MED ORDER — HYDROMORPHONE HCL 0.5 MG/0.5 ML IJ SOLN
0.5000 mg | INTRAMUSCULAR | Status: DC | PRN
Start: 2018-02-03 — End: 2018-02-03

## 2018-02-03 MED ORDER — ONDANSETRON HCL 4 MG/2ML IJ SOLN
INTRAMUSCULAR | Status: DC | PRN
Start: 2018-02-03 — End: 2018-02-03
  Administered 2018-02-03: 4 mg via INTRAVENOUS

## 2018-02-03 MED ORDER — LIDOCAINE HCL (PF) 1 % IJ SOLN
Freq: Once | INTRAMUSCULAR | Status: DC | PRN
Start: 2018-02-03 — End: 2018-02-03

## 2018-02-03 MED ORDER — CLINDAMYCIN PHOSPHATE IN D5W 900 MG/50ML IV SOLN
900.0000 mg | Freq: Once | INTRAVENOUS | Status: DC
Start: 2018-02-03 — End: 2018-02-03

## 2018-02-03 MED ORDER — SENNOSIDES-DOCUSATE SODIUM 8.6-50 MG PO TABS
1.00 | ORAL_TABLET | Freq: Every day | ORAL | 0 refills | Status: DC
Start: 2018-02-03 — End: 2018-02-20

## 2018-02-03 MED ORDER — ON-Q PUMP SINGLE FLOW
Status: DC
Start: 2018-02-03 — End: 2018-02-03
  Filled 2018-02-03: qty 550

## 2018-02-03 MED ORDER — ROCURONIUM BROMIDE 50 MG/5ML IV SOLN
INTRAVENOUS | Status: AC
Start: 2018-02-03 — End: ?
  Filled 2018-02-03: qty 5

## 2018-02-03 MED ORDER — FAMOTIDINE 20 MG/2ML IV SOLN
INTRAVENOUS | Status: AC
Start: 2018-02-03 — End: ?
  Filled 2018-02-03: qty 2

## 2018-02-03 MED ORDER — FENTANYL CITRATE (PF) 50 MCG/ML IJ SOLN (WRAP)
25.0000 ug | INTRAMUSCULAR | Status: DC | PRN
Start: 2018-02-03 — End: 2018-02-03

## 2018-02-03 MED ORDER — DEXAMETHASONE SODIUM PHOSPHATE 4 MG/ML IJ SOLN (WRAP)
INTRAMUSCULAR | Status: DC | PRN
Start: 2018-02-03 — End: 2018-02-03
  Administered 2018-02-03: 10 mg via INTRAVENOUS

## 2018-02-03 MED ORDER — GABAPENTIN 300 MG PO CAPS
ORAL_CAPSULE | ORAL | Status: AC
Start: 2018-02-03 — End: ?
  Filled 2018-02-03: qty 1

## 2018-02-03 MED ORDER — GABAPENTIN 300 MG PO CAPS
300.0000 mg | ORAL_CAPSULE | Freq: Once | ORAL | Status: AC
Start: 2018-02-03 — End: 2018-02-03
  Administered 2018-02-03: 10:00:00 300 mg via ORAL

## 2018-02-03 MED ORDER — FAMOTIDINE 10 MG/ML IV SOLN (WRAP)
INTRAVENOUS | Status: DC | PRN
Start: 2018-02-03 — End: 2018-02-03
  Administered 2018-02-03: 20 mg via INTRAVENOUS

## 2018-02-03 MED ORDER — PROPOFOL 10 MG/ML IV EMUL (WRAP)
INTRAVENOUS | Status: AC
Start: 2018-02-03 — End: ?
  Filled 2018-02-03: qty 50

## 2018-02-03 MED ORDER — ACETAMINOPHEN 325 MG PO TABS
325.0000 mg | ORAL_TABLET | Freq: Once | ORAL | Status: DC | PRN
Start: 2018-02-03 — End: 2018-02-03

## 2018-02-03 MED ORDER — PROMETHAZINE HCL 25 MG/ML IJ SOLN
6.2500 mg | Freq: Once | INTRAMUSCULAR | Status: DC | PRN
Start: 2018-02-03 — End: 2018-02-03

## 2018-02-03 MED ORDER — ACETAMINOPHEN 500 MG PO TABS
1000.00 mg | ORAL_TABLET | Freq: Three times a day (TID) | ORAL | 0 refills | Status: AC
Start: 2018-02-03 — End: 2018-02-17

## 2018-02-03 MED ORDER — GABAPENTIN 300 MG PO CAPS
300.0000 mg | ORAL_CAPSULE | Freq: Once | ORAL | Status: DC
Start: 2018-02-03 — End: 2018-02-03

## 2018-02-03 MED ORDER — MIDAZOLAM HCL 2 MG/2ML IJ SOLN
INTRAMUSCULAR | Status: AC
Start: 2018-02-03 — End: ?
  Filled 2018-02-03: qty 2

## 2018-02-03 MED ORDER — DEXAMETHASONE SODIUM PHOSPHATE 20 MG/5ML IJ SOLN
INTRAMUSCULAR | Status: AC
Start: 2018-02-03 — End: ?
  Filled 2018-02-03: qty 5

## 2018-02-03 MED ORDER — LACTATED RINGERS IV SOLN
INTRAVENOUS | Status: DC
Start: 2018-02-03 — End: 2018-02-03

## 2018-02-03 MED ORDER — MORPHINE SULFATE ER 15 MG PO TBCR
EXTENDED_RELEASE_TABLET | ORAL | Status: AC
Start: 2018-02-03 — End: ?
  Filled 2018-02-03: qty 1

## 2018-02-03 MED ORDER — LACTATED RINGERS IV SOLN
100.0000 mL/h | INTRAVENOUS | Status: DC
Start: 2018-02-03 — End: 2018-02-03

## 2018-02-03 MED ORDER — CEFAZOLIN SODIUM-DEXTROSE 2-3 GM-%(50ML) IV SOLR
2.0000 g | INTRAVENOUS | Status: AC
Start: 2018-02-03 — End: 2018-02-03
  Administered 2018-02-03: 11:00:00 2 g via INTRAVENOUS

## 2018-02-03 MED ORDER — OXYCODONE HCL 5 MG PO TABS
5.00 mg | ORAL_TABLET | ORAL | 0 refills | Status: AC | PRN
Start: 2018-02-03 — End: 2018-02-17

## 2018-02-03 MED ORDER — ROPIVACAINE HCL 5 MG/ML IJ SOLN
INTRAMUSCULAR | Status: DC | PRN
Start: 2018-02-03 — End: 2018-02-03
  Administered 2018-02-03: 20 mL via PERINEURAL
  Administered 2018-02-03: 30 mL via PERINEURAL

## 2018-02-03 MED ORDER — FENTANYL CITRATE (PF) 50 MCG/ML IJ SOLN (WRAP)
INTRAMUSCULAR | Status: AC
Start: 2018-02-03 — End: ?
  Filled 2018-02-03: qty 2

## 2018-02-03 MED ORDER — NEOSTIGMINE METHYLSULFATE 1 MG/ML IJ/IV SOLN (WRAP)
Status: DC | PRN
Start: 2018-02-03 — End: 2018-02-03
  Administered 2018-02-03: 3 mg via INTRAVENOUS

## 2018-02-03 MED ORDER — ACETAMINOPHEN 500 MG PO TABS
1000.0000 mg | ORAL_TABLET | Freq: Once | ORAL | Status: DC
Start: 2018-02-03 — End: 2018-02-03

## 2018-02-03 MED ORDER — ROPIVACAINE HCL 5 MG/ML IJ SOLN
Freq: Once | INTRAMUSCULAR | Status: DC | PRN
Start: 2018-02-03 — End: 2018-02-03

## 2018-02-03 MED ORDER — ROCURONIUM BROMIDE 10 MG/ML IV SOLN (WRAP)
INTRAVENOUS | Status: DC | PRN
Start: 2018-02-03 — End: 2018-02-03
  Administered 2018-02-03: 40 mg via INTRAVENOUS

## 2018-02-03 MED ORDER — CEFAZOLIN SODIUM-DEXTROSE 2-3 GM-%(50ML) IV SOLR
INTRAVENOUS | Status: AC
Start: 2018-02-03 — End: ?
  Filled 2018-02-03: qty 50

## 2018-02-03 MED ORDER — ACETAMINOPHEN 500 MG PO TABS
ORAL_TABLET | ORAL | Status: AC
Start: 2018-02-03 — End: ?
  Filled 2018-02-03: qty 2

## 2018-02-03 MED ORDER — ONDANSETRON HCL 4 MG/2ML IJ SOLN
INTRAMUSCULAR | Status: AC
Start: 2018-02-03 — End: ?
  Filled 2018-02-03: qty 2

## 2018-02-03 MED ORDER — ONDANSETRON HCL 4 MG/2ML IJ SOLN
4.0000 mg | Freq: Once | INTRAMUSCULAR | Status: DC | PRN
Start: 2018-02-03 — End: 2018-02-03

## 2018-02-03 MED ORDER — PROPOFOL INFUSION 10 MG/ML
INTRAVENOUS | Status: DC | PRN
Start: 2018-02-03 — End: 2018-02-03
  Administered 2018-02-03: 50 ug/kg/min via INTRAVENOUS

## 2018-02-03 MED ORDER — PHENYLEPHRINE 100 MCG/ML IN NACL 0.9% IV SOSY
PREFILLED_SYRINGE | INTRAVENOUS | Status: DC | PRN
Start: 2018-02-03 — End: 2018-02-03
  Administered 2018-02-03: 50 ug via INTRAVENOUS

## 2018-02-03 MED ORDER — ROPIVACAINE HCL 2 MG/ML IJ SOLN
INTRAMUSCULAR | Status: DC
Start: 2018-02-03 — End: 2018-02-03
  Filled 2018-02-03: qty 550

## 2018-02-03 MED ORDER — LIDOCAINE HCL (PF) 2 % IJ SOLN
INTRAMUSCULAR | Status: AC
Start: 2018-02-03 — End: ?
  Filled 2018-02-03: qty 5

## 2018-02-03 MED ORDER — MEPERIDINE HCL 25 MG/ML IJ SOLN
12.5000 mg | INTRAMUSCULAR | Status: DC | PRN
Start: 2018-02-03 — End: 2018-02-03

## 2018-02-03 MED ORDER — GLYCOPYRROLATE 0.2 MG/ML IJ SOLN
INTRAMUSCULAR | Status: DC | PRN
Start: 2018-02-03 — End: 2018-02-03
  Administered 2018-02-03: .6 mg via INTRAVENOUS

## 2018-02-03 MED ORDER — GABAPENTIN 300 MG PO CAPS
300.00 mg | ORAL_CAPSULE | Freq: Three times a day (TID) | ORAL | 1 refills | Status: DC
Start: 2018-02-03 — End: 2018-03-17

## 2018-02-03 MED ORDER — OXYCODONE HCL ER 10 MG PO T12A
10.0000 mg | EXTENDED_RELEASE_TABLET | Freq: Once | ORAL | Status: DC
Start: 2018-02-03 — End: 2018-02-03

## 2018-02-03 SURGICAL SUPPLY — 90 items
APPLCATOR CHLORAPREP 26ML (Prep) ×6 IMPLANT
BANDAGE ACE NONSTERILE 4IN LF (Bandage) ×1
BANDAGE CMPR PLSTR CTTN PRCR 5YDX6IN LF (Procedure Accessories) ×1
BANDAGE COMPRESSION L5 YD X W4 IN ELASTIC HOOK LOOP CLOSURE STRETCH (Bandage) IMPLANT
BANDAGE GAUZE L3.6 YD X W3.4 IN 6 PLY ABSORBENT STRETCH TIGHT FINISH (Bandage) ×1 IMPLANT
BANDAGE MEDLINE COMPRESSION L5 YD X W4 (Bandage) ×1
BANDAGE MEDLINE GAUZE L3.6 YD X W3.4 IN (Bandage) ×1
BANDAGE PLASTER 4X3YD XFST SET (Bandage) ×1 IMPLANT
BANDAGE PLASTER SPECIALIST PLASTER OF (Cast) ×4
BANDAGE PLASTER SPECIALIST PLASTER OF PARIS L5 YD X W4 IN SMOOTH (Cast) ×4 IMPLANT
BANDAGE PLSTR PLSTR OF PARIS SPCLST 5YD (Cast) ×4
BANDAGE PROCARE COMPRESSION L5 YD X W6 (Procedure Accessories) ×1
BANDAGE PROCARE COMPRESSION L5 YD X W6 IN 2 CLIP FASTENER BREATHABLE (Procedure Accessories) ×1 IMPLANT
BIT DRILL L110 MM OD2.5 MM QUICK COUPLING GOLD (Drillbits) IMPLANT
BIT DRILL L110 MM OD2.5 MM SYNTHES QUICK (Drillbits) ×1
BIT DRILL L125 MM OD2 MM QUICK COUPLING (Drillbits) IMPLANT
BIT DRILL L125 MM OD2 MM SYNTHES QUICK (Drillbits) ×2
BIT DRILL L85 MM OD1.5 MM QUICK COUPLING (Drillbits) IMPLANT
BIT DRILL L85 MM OD1.5 MM SYNTHES QUICK (Drillbits) ×1
BIT DRILL OD2 MM 5/64 (Drillbits) ×1
BIT DRILL OD2 MM 5/64 NA (Drillbits) ×1 IMPLANT
BIT DRL 1.5MM 85MM NS QC (Drillbits) ×1
BIT DRL 2.5MM 110MM NS QC AU REPRO (Drillbits) ×1
BIT DRL 2MM 125MM NS QC (Drillbits) ×2
BNDG BLK2 GZE 3.6YDX3.4IN CTTN 6 PLY ABS (Bandage) ×1
DRAPE 74X41IN UNIVERSAL POLY XRAY C ARM CLOSURE STRAP (Drape) ×1 IMPLANT
DRAPE C-ARM (Drape) ×2 IMPLANT
DRAPE EQP VLCR POLY UNV STRDRP 74X41IN (Drape) ×2
DRAPE SRG TBRN LG CNVRT 98X72IN LF STRL (Drape) ×4
DRAPE SURGICAL FANFOLD L98 IN X W72 IN (Drape) ×4
DRAPE SURGICAL FANFOLD L98 IN X W72 IN CONVERTORS TIBURON LARGE (Drape) ×4 IMPLANT
DRESSING PETRO 3% BI 3BRM GZE XR 8X1IN (Dressing) ×1
DRESSING PETRO 3% BI 3BRM GZE XR 9X5IN (Dressing) ×2
DRESSING PETROLATUM L9 IN X W5 IN NONADHESIVE OCCLUSIVE BACTERIOSTATIC (Dressing) ×1 IMPLANT
DRESSING PETROLATUM XEROFORM L8 IN X W1 (Dressing) ×1
DRESSING PETROLATUM XEROFORM L8 IN X W1 IN 3% BISMUTH TRIBROMOPHENATE (Dressing) IMPLANT
DRILL BIT 5/64IN 2.0X128MM (Drillbits) ×1
ELECTRODE ADULT PATIENT RETURN L9 FT REM POLYHESIVE ACRYLIC FOAM (Procedure Accessories) ×1 IMPLANT
ELECTRODE PATIENT RETURN L9 FT VALLEYLAB (Procedure Accessories) ×1
ELECTRODE PT RTN RM PHSV ACRL FM C30- LB (Procedure Accessories) ×1
GLOVE SRG NTR RBR 8 INDCTR BGL 299X103MM (Glove) ×1
GLOVE SURG BIOGEL LF SZ8 (Glove) ×4 IMPLANT
GLOVE SURGICAL 8 INDICATOR BIOGEL POWDER (Glove) ×1
GLOVE SURGICAL 8 INDICATOR BIOGEL POWDER FREE SMOOTH BEAD CUFF (Glove) ×1 IMPLANT
HOOK SPNL TI CD HZN ANG BLDE (Plate) ×1 IMPLANT
PADDING CAST L4 YD X W4 IN COHESION HAND TEARABLE SELF BOND SPECIALIST (Procedure Accessories) ×4 IMPLANT
PADDING CAST L4 YD X W4 IN UNDERCAST (Cast) ×1
PADDING CAST L4 YD X W4 IN UNDERCAST MILD STRETCH COHESIVE REGULAR (Cast) ×1 IMPLANT
PADDING CST CTTN SPCLST 100 4YDX4IN LF (Procedure Accessories) ×2
PADDING CST CTTN WBRL 4YDX4IN LF STRL (Cast) ×1
PLATE 2.0MM STRAIGHT 35MM 6 HOLES (Plate) IMPLANT
PLATE BN SS 1/3 TBLR LCP CMBN 12MM 93X9 (Plate) ×1 IMPLANT
PLATE L93 MM X W9 MM X H1 MM 1/3 TUBULAR (Plate) ×1 IMPLANT
PLATE L93 MM X W9 MM X H1 MM 1/3 TUBULAR 8 HOLE COLLAR STAINLESS STEEL (Plate) IMPLANT
SCREW BN SS 2.5MM FT 2.7MM 5MM 20MM NS (Screw) ×1 IMPLANT
SCREW BN SS 2.5MM FT 2.7MM 5MM 45MM NS (Screw) ×2 IMPLANT
SCREW BN SS 2.5MM FT 3.5MM 12MM NS ST (Screw) ×2 IMPLANT
SCREW BN SS 2.5MM FT 3.5MM 14MM NS ST (Screw) ×1 IMPLANT
SCREW BN SS 2.5MM FT 3.5MM 16MM NS ST (Screw) ×1 IMPLANT
SCREW BN SS 2MM 3.5MM 10MM NS ST CRFRM (Screw) ×6 IMPLANT
SCREW BN SS 3.5MM 6MM 18MM NS ST LOPRFL (Screw) ×1 IMPLANT
SCREW BONE L12 MM OD3.5 MM 2.5 MM FULL THREAD STAINLESS STEEL CORTEX (Screw) IMPLANT
SCREW BONE L14 MM OD3.5 MM 2.5 MM FULL THREAD STAINLESS STEEL CORTEX (Screw) IMPLANT
SCREW BONE L16 MM OD3.5 MM 2.5 MM FULL THREAD STAINLESS STEEL CORTEX (Screw) IMPLANT
SCREW L10 MM OD2 MM ODSEC3.5 MM (Screw) ×6 IMPLANT
SCREW L10 MM OD2 MM ODSEC3.5 MM STAINLESS STEEL CORTEX SELF TAP (Screw) IMPLANT
SCREW L12 MM OD3.5 MM 2.5 MM FULL THREAD (Screw) ×2 IMPLANT
SCREW L14 MM OD3.5 MM 2.5 MM FULL THREAD (Screw) ×1 IMPLANT
SCREW L16 MM OD3.5 MM 2.5 MM FULL THREAD (Screw) ×1 IMPLANT
SCREW L18 MM OD3.5 MM ODSEC6 MM (Screw) ×1 IMPLANT
SCREW L18 MM OD3.5 MM ODSEC6 MM STAINLESS STEEL CORTEX SELF TAP LOW (Screw) IMPLANT
SCREW L20 MM OD2.7 MM ODSEC5 MM 2.5 MM (Screw) ×1 IMPLANT
SCREW L20 MM OD2.7 MM ODSEC5 MM 2.5 MM FULL THREAD STAINLESS STEEL (Screw) IMPLANT
SCREW L45 MM OD2.7 MM ODSEC5 MM 2.5 MM (Screw) ×2 IMPLANT
SCREW L45 MM OD2.7 MM ODSEC5 MM 2.5 MM FULL THREAD STAINLESS STEEL (Screw) IMPLANT
SOLUTION ANSEP 70% ISPRP 4OZ LF RUB BTL (Scrub Supplies) ×1
SOLUTION ANTISEPTIC RUB BOTTLE MEDLINE (Scrub Supplies) ×1 IMPLANT
SPONGE GAUZE L4 IN X W4 IN 12 PLY (Sponge) ×2 IMPLANT
SPONGE GAUZE L4 IN X W4 IN 16 PLY (Dressing) ×1
SPONGE GAUZE L4 IN X W4 IN 16 PLY MAXIMUM ABSORBENT USP TYPE VII (Dressing) IMPLANT
SPONGE GZE CTTN CRTY 4X4IN LF NS 16 PLY (Dressing) ×1
SPONGE GZE PLS CTTN CRTY 4X4IN LF STRL (Sponge) ×2
STAPLER SKIN L4.1 MM X W6.5 MM 35 WIDE (Staplers) ×1
STAPLER SKIN L4.1 MM X W6.5 MM 35 WIDE STAPLE CARTRIDGE APPOSE ULC (Staplers) ×1 IMPLANT
STAPLER SKN SS PLS APS U 4.1X6.5MM LF 35 (Staplers) ×1
SUT MONOCRYL 5-0 PS-2 18IN (Suture) ×2 IMPLANT
SUTURE ETHILON 3-0 30IN PSLX (Suture) ×3 IMPLANT
TOURNIQUET 34IN STRL (Procedure Accessories) ×2 IMPLANT
TOWEL STERILE REUSABLE 8PK (Procedure Accessories) ×2 IMPLANT
TRAY EXTREMITY PACK (Pack) ×2 IMPLANT

## 2018-02-03 NOTE — Transfer of Care (Signed)
Anesthesia Transfer of Care Note    Patient: Anna Lamb    Procedures performed: Procedure(s) with comments:  ORIF, ANKLE, TRIMALLEOLAR - ORIF LEFT TRIMALLEOLAR ANKLE FRACTURE WITHOUT FIXATION OF THE POSTERIOR LIP    Anesthesia type: General ETT and Regional Anesthesia    Patient location:PACU    Last vitals:   Vitals:    02/03/18 1304   BP: 98/50   Pulse: 82   Resp: 16   Temp: 36.2 C (97.1 F)   SpO2: 99%       Post pain: Patient not complaining of pain, continue current therapy      Mental Status:awake and alert     Respiratory Function: tolerating room air    Cardiovascular: stable    Nausea/Vomiting: patient not complaining of nausea or vomiting    Hydration Status: adequate    Post assessment: no apparent anesthetic complications, no reportable events, no evidence of recall and Patient was able to participate in post-op evaluation but recovery from regional anesthesia has not occured and was not expected to occur within this time frame.    Signed by: Meyer Cory  02/03/18 1:05 PM

## 2018-02-03 NOTE — Interval H&P Note (Signed)
Pt presents today for surgery as scheduled.     Please refer to recent office/hospital notes for full detail    Past Medical/Surgical History, Meds/Allergies- reviewed    Physical Exam Update:  Neuro- Awake and alert  Cardiac- RRR  Pulmonary-  +BS  Abdomen-  No acute findings    Musculoskeletal exam noted previously    A/P:  Proceed with surgery as scheduled    Anna Almond, MD  Attending Orthopaedic Traumatologist  [facility]  [phone]  Pager [phone]

## 2018-02-03 NOTE — Brief Op Note (Signed)
Date/Time: 3:46 PM, 02/03/2018    Patient Name:     Anna Lamb    Date of Operation:     02/03/2018    Providers Performing:     Surgeon(s) and Role:     * Amado Nash, MD - Primary     * Finis Bud, [place] - Physician Assistant    Please page the surgical assistant or Ortho on call ([phone]) for order clarifications    Diagnosis:     * No post-op diagnosis entered *    Procedure:  Procedure(s) (LRB):  ORIF, ANKLE, TRIMALLEOLAR (Left)      Anesthesia:      General  No responsible provider has been recorded for the case.  Anesthesiologist: Valere Dross, MD  CRNA: Meyer Cory, CRNA    Prep:     Prior to Surgery  Pre-Prep Scrub: Alcohol Soaked Gauze  Scrub Brush: N/A  Skin Prep Used: DuraPrep    During Surgery  Co-Interventions: Yes  If, Yes, Please Specify Co-Interventions: Saline    Re-Prep During Surgery  Please Specify if Fracture(s) Re-Prepped: N/A    Antiobiotics  Local Antibiotics Placed in Wound: N/A  Specify Antibiotics: N/A    Estimated Blood Loss:     * No blood loss amount entered *    Implants:       Implant Name Type Inv. Item Serial No. Manufacturer Lot No. LRB No. Used Action   SCREW CORTEX S TAP 2.0X10MM - [id number] Screw SCREW CORTEX S TAP 2.0X10MM  [employer]  Left 6 Implanted   SCREW CORTEX S TP 2.7X20MM - [id number] Screw SCREW CORTEX S TP 2.7X20MM  [employer]  Left 1 Implanted   PLATE STR 6HL 35MM - [id number] Plate PLATE STR 6HL 35MM  [employer]  Left 1 Implanted   PLATE 1/3 LCP TUB W/COLR 8H/93 - [id number] Plate PLATE 1/3 LCP TUB W/COLR 8H/93  [employer]  Left 1 Implanted   SCREW CORT 3.5MM S TAP - [id number] Screw SCREW CORT 3.5MM S TAP  [employer]  Left 2 Implanted   SCREW CORT S TP 3.5X14MM - [id number] Screw SCREW CORT S TP 3.5X14MM  [employer]  Left 1 Implanted   SCREW CORT S TP 3.5X16MM - [id number] Screw SCREW CORT S TP 3.5X16MM  [employer]  Left 1 Implanted   SCREW CORT S TP 3.5X18MM - [id number] Screw SCREW CORT S TP  3.5X18MM  [employer]  Left 1 Implanted   SCREW CORTX S TP 2.7X45MM - [id number] Screw SCREW CORTX S TP 2.7X45MM   [employer]   Left 2 Implanted       Drains:   None    Complications:     None    Closed Fracture Identification:     Number of Closed Fracture(s): 1    Closed Fracture Classification:     Please Complete the Following in Order of the Closed Fractures with the Highest to Lowest Risk for SSI    [Closed Fracture #1]    Severe Soft Tissue Injury: No  If, Yes: N/A  AO/OTA Classification: 44C1    Open Fracture Identification:     Number of Open Fracture(s): 0    Fracture Wound Management (Open and/or Closed Fractures):     Wound Definitively Managed: Yes, there are no plans for further wound procedures    Closure Method: Primary Closure    Wound Size Requiring Flap and/or Skin Graft: NA    Negative Pressure VAC Dressing:  No    Elton Sin PA-C  Orthopedic Trauma Surgery   [phone]  Pager: [phone]  Ortho On Call Pager: [phone]

## 2018-02-03 NOTE — Anesthesia Procedure Notes (Signed)
Peripheral    Patient location during procedure: pre-op  Reason for block: Post-op pain managment  Injection technique: Catheter  Block Region: Popliteal sciatic  Laterality: Left  Block at surgeon's request Yes    Staffing  Anesthesiologist: Siah Kannan  Performed: Anesthesiologist     Pre-procedure Checklist   Completed: patient identified, surgical consent, pre-op evaluation, timeout performed, risks and benefits discussed, anesthesia consent given and correct site      Peripheral Block  Patient monitoring: NIBP, Pulse oximetry, EKG and Nasal cannula O2  Premedication: Yes and Meaningful contact maintained  Local infiltration: Lidocaine 1%    Needle  Needle type: Tuohy   Needle gauge: 18 G  Needle length: 10 cm  Catheter size: 20 G    Procedures: ultrasound guided  Ultrasound Guided: LA spread visualized, Image stored or printed, Needle visualized, Relevant anatomy identified (nerve, vessels, muscle), Air contrast visualized and Catheter visualized      Assessment   Incremental injection: yes  Injection made incrementally with aspirations every 5 mL.  Injection Resistance: no  Paresthesia Pain: no    Blood Aspirated: No  no suspected intravascular injection  Patient tolerated procedure well: Yes  Block Outcome: No complications, Successful block and Pain improved

## 2018-02-03 NOTE — Discharge Instr - AVS First Page (Addendum)
ORTHOPEDIC TRAUMA SERVICE DISCHARGE INSTRUCTIONS    [facility]             [address]            [place], [place] [address]          [phone] Phone, [phone] Fax      Please contact the office as soon as possible to schedule a follow-up appointment with:  Dr. Emiliano Dyer, MD    You will need to be seen approximately 2 weeks from the date of your surgery  Day of Surgery  S/p Procedure(s) (LRB):  ORIF, ANKLE, TRIMALLEOLAR (Left)   Your staples/sutures will be removed at one of your office visits. Do not allow anyone else to remove your staples.    Activity Weight Bearing Status    Non weight bearing left leg.     Medications    If you have a pain catheter or pump - still take your pain medications as prescribed, even if you feel you do not need them.  Once the pain catheter/pump is discontinued or stops working, it wears off extremely quickly and you will experience pain that is difficult to control if you have not been using your pain medications as prescribed.     If you believe you are having a problem with the pain catheter/pump, please call the number included in the pain pump instructions.  If you feel there is something wrong with your cast/splint/injury site, then please call the office line above to be seen, or come to the emergency department.     You should be taking a stool softener (ex. Colace) as long as you are on any narcotic pain medicines.  You should be having a bowel movement every 2-3 days.  If not, then  proceed with over the counter laxatives (Senokot -S/Miralax), enemas, or suppositories to fix the constipation.    You may take tylenol per package directions provided your pain medication does not have any tylenol in it (Percocet, Vicodin, and Norco all have tylenol. Do not take more than 3000 mg in a 24 hr period.    If you are taking NSAIDs (Ibuprofen, naprosyn, celebrex) - after approximately 2 weeks, only take them on an as needed  basis - do not take them around the clock.     Skilled Nursing Facility Patients  If wound develops erythema or drainage, please do not start on antibiotics, please send patient to Orthopaedic office for a wound check.     Wound Care  -  Change your dressings as needed with sterile gauze (do not have to change if clean and dry).  -  If you have soft dressings only (no splints or casts), remove dressing and  you may take a shower 3 days after surgery (no baths). Do not scrub incision. Pat dry with a clean towel.  -  Do not remove splints/casts. This will be addressed at our follow-up appointment. If you feel that your cast/splint may be too tight or there maybe another issue with it, please call the office to be seen or come to the emergency room.   -  Keep your splint/cast clean and dry.  -  If you have increased drainage, incision redness, foul smell, or fevers - inform the      office.  You may need to be evaluated in the office earlier than your follow-up      appointment.    Diet  -  You  may resume your normal diet.  -  It is quite common to have a decreased appetite for the first week or two following       your injury/surgery.  This may be due to your pain level and the pain medications you      are taking.  Be sure to drink plenty of fluids during this time to remain hydrated.   -  You should be taking a stool softener (ex. Colace) as long as you are on any narcotic      pain medicines.  You should be having a bowel movement every 2-3 days.  If not, then       proceed with over the counter laxatives, enemas, or suppositories to fix the      constipation     X-Rays (Radiology Studies)  -  In most cases, we will perform post-op x-rays at our office at the time of your follow-up      appointment.  Check with your health insurance carrier if this is permitted.  If not, they      can provide a list of providers which are covered.   -  If your x-rays (or MRI or CT) are to be performed anywhere other than our  office, you         must make sure they give you the actual x-rays or a CD-ROM to bring with you to the         office visit.  A radiology report is not adequate; the surgeon must visualize the actual      images to determine your plan of care.     [facility] Patients  All outpatient radiology studies (X-ray, CT, MRI, etc) and physical therapy will need to be obtained at a [facility].  You may take the prescriptions which you received to your primary physician, so that they can enter them as a referral in the [facility] system.  You will need to bring the actual films or a CD-ROM with you to the follow-up appointment.  Radiology reports are not sufficient    [facility] Patients  If you have an upcoming planned surgery, you must go to your primary care physician and get a referral for the surgery.  Start this process ASAP, as it takes up to a week to accomplish.       Physical/Occupational Therapy  During the rehabilitation course from your injury, we may prescribe physical or occupational therapy.  In these cases, we will provide you with a list of therapists in the area.  This list in not comprehensive, and you are welcome to go to any therapist that you like - however, you must see if they accept your insurance, or you will be expected to pay at the time services are rendered.     The [facility] are all part of the same healthcare system as [facility] and our office.  These centers accept most health insurance plans, or will participate in the financial arrangement (charity/discounted payment plan) that was put into effect when you presented to [facility].    Blood Clot Prevention    IF YOU ARE A BLOOD THINNER STUDY PATIENT, THIS INFORMATION DOES NOT PERTAIN TO YOU.  STUDY PATIENTS SHOULD TAKE THEIR BLOOD THINNERS PER THE STUDY PROTOCOL.  IF YOU DO NOT KNOW IF YOU ARE A STUDY PATIENT, PLEASE CALL LOLITA RAMSEY - [phone] TO FIND OUT    You may be given a  prescription for a blood thinner (Lovenox) that is meant to minimize your chances for developing blood clots.  These clots are call Deep Venous Thrombosis (DVT) or Pulmonary Embolus (PE).  While preventative measures are not 100% effective, they greatly decrease the chance of developing a DVT or PE.  Usually, when prescribed, we recommend using this for a total of 3 weeks post operatively for fractures below the hip, and 6 weeks for hip/acetabulum/pelvis fractures. Sometimes the course of this medicine will be longer, depending on your injuries.  The nurse in the hospital will teach you the proper technique to do these injections yourself at home.     If you choose not to have this prescription filled, you are at risk of having a VTE (blood clot in leg, arm, or chest). If you do not get this prescription filled, then we strongly suggest that you take aspirin (81 mg) twice per day.   Marland Kitchen      Special Instructions: Call asap to arrange your f/u appt.     [email or web address]                             [facility] Anesthesiology and Pain Medicine    HOME WITH A NERVE BLOCK CATHETER - DISCHARGE INSTRUCTIONS          The MOST common patient questions include:    ? Is my On-Q pump working?  ? The ball is not getting smaller, what do I do?  ? Why is my catheter leaking?  ? My catheter dressing/tape is loose, what do I do?  ? How do I know if my On-Q pump is empty or if my infusion is finished?  ? How do I remove the catheter(s)?  - [email or web address]  - Click on 'My ON-Q Pump'. Scroll down to Patient Education and click on Removal of Catheter Video.         Call the 24-hour On-Q pump nursing support line: [phone]      What to expect with your catheter(s):    ? Numbness, tingling, or weakness in the area blocked is NORMAL/COMMON.  ? Infusion of the medication will not take all pain away. Take pain medication as prescribed.  ? Some fluid leaking around the catheter or dressing is expected and  okay. Reinforce with dressing to keep catheter site covered.  ? Do not change the dressing or tape, you will REMOVE the catheter!  ? Did you have surgery on your hip, leg, knee or foot? DO NOT get up alone. Ask for help to avoid falling!   Be careful walking.  ? Did you have surgery on your shoulder, clavicle, arm or hand? You may have any of these symptoms. This is expected and will wear off!  - Droopy eye, stuffy nose, pin-point pupil, hoarseness, weak voice.  - Feeling that you cannot take a deep breath.      Other questions or concerns? Call the Anesthesiology service Line: [phone]    ? If you have SEVERE pain, redness or swelling around catheter, Temp > 102 degrees  ? If numbness doesn't go away AFTER 24 hours of removal of catheter  ? For numbness/tingling around the mouth, metallic taste, ringing in ears:  Clamp your catheter and IMMEDIATELY call the anesthesiology pain medicine service  Extremely rare symptoms include confusion, seizures, fainting, or unusual chest pain: Call 911      When is the infusion complete?  We estimate  that your pump will be empty and infusion complete on:    __________________ at ___________.    Throw the pain pump system, dressing, tape and catheter into the trash after removal.

## 2018-02-03 NOTE — Anesthesia Postprocedure Evaluation (Signed)
Anesthesia Post Evaluation    Patient: Anna Lamb    Procedure(s) with comments:  ORIF, ANKLE, TRIMALLEOLAR - ORIF LEFT TRIMALLEOLAR ANKLE FRACTURE WITHOUT FIXATION OF THE POSTERIOR LIP    Anesthesia type: general    Last Vitals:   Vitals:    02/03/18 1410   BP:    Pulse: 74   Resp: 18   Temp:    SpO2: 98%       Anesthesia Post Evaluation:     Patient Evaluated: PACU    Level of Consciousness: awake and alert  Pain Score: 0  Pain Management: adequate    Airway Patency: patent    Anesthetic complications: No      PONV Status: none    Cardiovascular status: acceptable  Respiratory status: acceptable  Hydration status: acceptable        Anesthesia Qualified Clinical Data Registry 2018    PACU Reintubation  Did the Patient have general anesthesia with intubation: No        PONV Adult  Is the patient aged 55 or older: Yes  Did the patient receive recieve a general anesthestic: Yes  Does the patient have 3 or more risk factors for PONV? No  Did the patient receive anti-emetics from at least two classes of medications? Yes      PONV Pediatric  Is the patient aged 61-17? No            PACU Transfer Checklist Protocol  Was the patient transferred to the PACU at the conclusion of surgery? Yes  Was a checklist or transfer protocol used? Yes    ICU Transfer Checklist Protocol  Was the patient transferred to the ICU at the conclusion of surgery? No      Post-op Pain Assessment Prior to Anesthesia Care End  Age >=18 and assessed for pain in PACU: Yes  Pacu pain score <7/10: Yes      Perioperative Mortality  Perioperative mortality prior to Anesthesia end time: No    Perioperative Cardiac Arrest  Did the patient have an unanticipated intraoperative cardiac arrest between anesthesia start time and anesthesia end time? No    Unplanned Admission to ICU  Did the patient have an unplanned admission to the ICU (not initially anticipated at anesthesia start time)? No      Signed by: Valere Dross, 02/03/2018 2:16 PM

## 2018-02-03 NOTE — Progress Notes (Signed)
Left Adductor Canal Peripheral nerve block with catheter and left popliteal sciatic peripheral nerve block (with catheter) completed. Vital signs monitored and charted. Sterile protocol maintained. Patient tolerated procedure well. Discussed expected outcomes, (pain control,decreased opioid requirements and side effects, limb safety). Patient verbalized understanding.      Anesthesiologist Dr. Rolene Course at bedside.

## 2018-02-03 NOTE — Discharge Instructions (Signed)
[facility] Anesthesiology and Pain Medicine    [facility] WITH A NERVE BLOCKCATHETER DISCHARGE INSTRUCTIONS  WHAT TO EXPECT WITH YOUR CATHETER(S):   Numbess, tingling, and weakness in area blocked is NORMAL.   Some pain or discomfort is NORMAL.  Take pain medication as prescribed.   Some fluid leaking around the catheter or dressing is expected and okay.  Reinforce with dressing to keep catheter site covered.   Do not change the dressing or tape, you will REMOVE the catheter!   Did you have surgery on your hip, leg, knee or foot? DO NOT GET UP ALONE.  Ask for help to avoid falling ! Be Careful Walking.   Did you have surgery on your shoulder, clavicle, arm or hand? You may have any of these symptoms.  This is expected and will wear off !    Droopy eye, stuffy nose, pin-point pupil, hoarseness, weak voice.   Feeling that you cannot take a deep breath.  Have questions? Call ON-Q specialists at this number FIRST: [phone]   Is my On-Q pump working?   The ball is not getting smaller, what do I do?   Why is my catheter leaking?   My catheter dressing/tape is loose, what do I do?   How do I remove the catheter(s)?   [email or web address]   Click on 'My ON-Q Pump'. Scroll down to patient education and click on removal of Catheter Video.  If you still have questions, call the [facility] service: [phone]   If you have SEVERE pain, redness or swelling around catheter, Temp>102   If numbness doesn't go away AFTER 24 hours of removal of catheter   For  Numbness/tingling around the mouth, metallic taste, ringing in ears: Clamp your catheter and IMMEDIATELY call the anesthesiology pain medicine service   Extremely rare symptoms include confusion, seisures, fainting, unusual chest pain: Call 911  When is the infusion complete?  You can remove the catheter on this Date and Time  Date:_________________   Time:_________________                Discharge Instructions: After Your Surgery  You've just had surgery. During surgery, you were given medicine called anesthesia to keep you relaxed and free of pain. After surgery, you may have some pain or nausea. This is common. Here are some tips for feeling better and getting well after surgery.     Stay on schedule with your medicine.   Going home  Your healthcare provider will show you how to take care of yourself when you go home. He or she will also answer your questions. Have an adult family member or friend drive you home. For the first 24 hours after your surgery:   Do not drive or use heavy equipment.   Do not make important decisions or sign legal papers.   Do not drink alcohol.   Have someone stay with you, if needed. He or she can watch for problems and help keep you safe.  Be sure to go to all follow-up visits with your healthcare provider. And rest after your surgery for as long as your healthcare provider tells you to.  Coping with pain  If you have pain after surgery, pain medicine will help you feel better. Take it as told, before pain becomes severe. Also, ask your healthcare provider or pharmacist about other ways to control pain. This might be with heat, ice, or relaxation. And follow any other instructions your surgeon or  nurse gives you.  Tips for taking pain medicine  To get the best relief possible, remember these points:   Pain medicines can upset your stomach. Taking them with a little food may help.   Most pain relievers taken by mouth need at least 20 to 30 minutes to start to work.   Taking medicine on a schedule can help you remember to take it. Try to time your medicine so that you can take it before starting an activity. This might be before you get dressed, go for a walk, or sit down for dinner.   Constipation is a common side effect of pain medicines. Call your healthcare provider before taking any medicines such as laxatives or stool  softeners to help ease constipation. Also ask if you should skip any foods. Drinkinglots of fluids andeating foodssuch as fruits and vegetables that are high in fiber can also help. Remember, do not take laxatives unless your surgeon has prescribed them.   Drinking alcohol and taking pain medicine can cause dizziness and slow your breathing. It can even be deadly. Do not drink alcohol while taking pain medicine.   Pain medicine can make you react more slowly to things. Do not drive or run machinery while taking pain medicine.  Your healthcare providermay tell you to take acetaminophen to help ease your pain. Ask him or her how much you are supposed to take each day. Acetaminophen or other pain relievers may interact with your prescription medicines or other over-the-counter (OTC) medicines. Some prescription medicines have acetaminophen and other ingredients.Using both prescription and OTC acetaminophenfor paincan cause you to overdose. Readthe labels on your OTC medicineswith care. This will help youto clearly know the list of ingredients, how much to take, and anywarnings. It may also help you not take too muchacetaminophen.If you have questions or do not understand the information, ask your pharmacist or healthcare provider to explain it to you before you take the OTC medicine.  Managing nausea  Some people have an upset stomach after surgery. This is often because of anesthesia, pain, or pain medicine, or the stress of surgery. These tips will help you handle nausea and eat healthy foods as you get better. If you were on a special food plan before surgery, ask your healthcare provider if you should follow it while you get better. These tips may help:   Do not push yourself to eat. Your body will tell you when to eat and how much.   Start off with clear liquids and soup. They are easier to digest.   Next try semi-solid foods, such as mashed potatoes, applesauce, and gelatin, as you feel  ready.   Slowly move to solid foods. Don't eat fatty, rich, or spicy foods at first.   Do not force yourself to have 3 large meals a day. Instead eat smaller amounts more often.   Take pain medicines with a small amount of solid food, such as crackers or toast, to avoid nausea.    Call your surgeon if.   You still have pain an hour after taking medicine. The medicine may not be strong enough.   You feel too sleepy, dizzy, or groggy. The medicine may be too strong.   You have side effects like nausea, vomiting, or skin changes, such as rash, itching, or hives.     If you have obstructive sleep apnea  You were given anesthesia medicine during surgery to keep you comfortable and free of pain. After surgery, you may have more apnea  spells because of this medicine and other medicines you were given. The spells may last longer than usual.  At home:   Keep using the continuous positive airway pressure (CPAP) device when you sleep. Unless your healthcare provider tells you not to, use it when you sleep, day or night. CPAP is a common device used to treat obstructive sleep apnea.   Talk with your provider before taking any pain medicine, muscle relaxants, or sedatives. Your provider will tell you about the possible dangers of taking these medicines.  Date Last Reviewed: 07/24/2015   2000-2018 [employer]. [address], [place] [address]. All rights reserved. This information is not intended as a substitute for professional medical care. Always follow your healthcare professional's instructions.

## 2018-02-03 NOTE — Progress Notes (Signed)
All belongings w patient, pt free of distress, vitals stable, d/c instructions given to patient and mother, pt tolerating po.

## 2018-02-03 NOTE — Anesthesia Preprocedure Evaluation (Signed)
Anesthesia Evaluation    AIRWAY    Mallampati: III    TM distance: >3 FB  Neck ROM: full  Mouth Opening:full   CARDIOVASCULAR    cardiovascular exam normal       DENTAL    no notable dental hx     PULMONARY    pulmonary exam normal     OTHER FINDINGS                  Relevant Problems   No relevant active problems               Anesthesia Plan    ASA 1     general                     intravenous induction   Detailed anesthesia plan: general endotracheal and PNB  Monitors/Adjuncts: BIS      Post op pain management: per surgeon and PNB catheter    informed consent obtained    ECG reviewed  pertinent labs reviewed             Signed by: Valere Dross 02/03/18 11:51 AM

## 2018-02-03 NOTE — Anesthesia Procedure Notes (Signed)
Peripheral    Patient location during procedure: pre-op  Reason for block: Post-op pain managment  Injection technique: Catheter  Block Region: Adductor canal/Mid-thigh femoral  Laterality: Left  Block at surgeon's request Yes    Staffing  Anesthesiologist: Kolbee Stallman  Performed: Anesthesiologist     Pre-procedure Checklist   Completed: patient identified, surgical consent, pre-op evaluation, timeout performed, risks and benefits discussed, anesthesia consent given and correct site      Peripheral Block  Patient monitoring: NIBP, Pulse oximetry, EKG and Nasal cannula O2  Patient position: Supine  Premedication: Yes and Meaningful contact maintained  Local infiltration: Lidocaine 1%    Needle  Needle type: Tuohy   Needle gauge: 18 G  Needle length: 10 cm  Catheter size: 20 G    Procedures: ultrasound guided  Ultrasound Guided: LA spread visualized, Image stored or printed, Needle visualized, Relevant anatomy identified (nerve, vessels, muscle), Air contrast visualized and Catheter visualized      Assessment   Incremental injection: yes  Injection made incrementally with aspirations every 5 mL.  Injection Resistance: no  Paresthesia Pain: no    Blood Aspirated: No  no suspected intravascular injection  Patient tolerated procedure well: Yes  Block Outcome: No complications, Successful block and Pain improved

## 2018-02-03 NOTE — H&P (View-Only) (Signed)
Orthopaedic Trauma New Patient Visit    Chief Complaint   Patient presents with   . Ankle Injury     2 days S/P Fracture dislocation of the left ankle DOI 01/23/2018       HPI: Anna Lamb is a 47 y.o. female.  She presents for examination of her left ankle.  She sustained this fracture while walking downstairs sustaining a fall.  She landed backwards into a wall twisted her ankle while doing so.  Patient also states that she fell twice in the lobby downstairs and almost fell in the office this morning.  She is using a walker.  The staff witnessed her walking using the walker correctly and gave her some corrective instructions and demonstrations.    ROS: Denies fevers, chills, chest pain, shortness of breath, nausea, vomiting, or diarrhea.    Patient History:  Patient Active Problem List    Diagnosis Date Noted   . Dislocation of left ankle joint 01/25/2018       Current Outpatient Prescriptions   Medication Sig Dispense Refill   . DULoxetine (CYMBALTA) 60 MG capsule      . HYDROcodone-acetaminophen (NORCO) 5-325 MG per tablet Take 1 tablet by mouth every 6 (six) hours as needed for Pain 12 tablet 0   . SYNTHROID 125 MCG tablet      . ondansetron (ZOFRAN ODT) 4 MG disintegrating tablet Take 1 tablet (4 mg total) by mouth every 6 (six) hours as needed for Nausea. 8 tablet 0     No current facility-administered medications for this visit.      .  Allergies: Penicillin v potassium and Sulfa antibiotics  Past Medical History:   Diagnosis Date   . Anxiety    . Asthma    . Depression    . Disorder of thyroid    . Migraine      Past Surgical History:   Procedure Laterality Date   . THYROID SURGERY       Family History   Problem Relation Age of Onset   . No known problems Mother    . No known problems Father      Social History   Substance Use Topics   . Smoking status: Current Every Day Smoker     Packs/day: 0.50     Years: 25.00     Types: Cigarettes   . Smokeless tobacco: Never Used   . Alcohol use No       In  addition to findings reviewed in EPIC, relevant Medical/Family/Social History noted in today's HPI, otherwise are noncontributory to today's visit.    Physical Exam: Pt is a well developed, well nourished 47 y.o. year old female who is awake, alert, and oriented.  The patient has a normal affect and is in no acute distress.  Vitals:    01/25/18 1425   BP: 140/83   Pulse: (!) 103   Resp: 16       Gait: Gait cannot be assessed as the patient is nonweightbearing on her left lower extremity.    Patient has a fairly labile affect.  She is intermittently crying and laughing.  Her splint was not removed secondary to severe discomfort.  Toes show good capillary refill in the a warm.  She is able to flex and extend them.    For comparison, their contralateral extremity is normal on exam with no pain nor crepitus.    Radiology:     Xrays taken in clinic today: View of previous radiographs  demonstrates a trimalleolar ankle fracture on the left.    Impression: Unstable trimalleolar ankle fracture on the left.    Plan: Operative internal fixation next week after the swelling has diminished.    The patient was counseled as to the indications, goals, options, alternative, risks, benefits and complications of surgical intervention, including, but not limited to: infection, bleeding, pain, failure of fixation, non/mal-union, gait issues, failure to recover, dislocation, pain, stiffness, weakness, injury to local structures, need for additional surgery, wound problems,as well as medical and  anesthesia complications, including death, MI, CVA, VTE, pneumonia, and UTI, among others. They understand and wish to proceed. In addition, the anticipated healing and rehabilitative course was discussed. All questions were answered.    Anna Perdew A. Rilley Stash, MD  Orthopaedic Trauma Surgeon  [facility]        The review of the patient's medications does not in any way constitute an endorsement, by this clinician,  of their use,  dosage, indications, route, efficacy, interactions, or other clinical parameters.    This note was generated within the EPIC EMR using Dragon medical speech recognition software and may contain inherent errors or omissions not intended by the user. Grammatical and punctuation errors, random word insertions, deletions, pronoun errors and incomplete sentences are occasional consequences of this technology due to software limitations. Not all errors are caught or corrected.  Although every attempt is made to root out erroneus and incomplete transcription, the note may still not fully represent the intent or opinion of the author. If there are questions or concerns about the content of this note or information contained within the body of this dictation they should be addressed directly with the author for clarification.

## 2018-02-04 NOTE — Op Note (Signed)
Procedure Date: 02/03/2018     Patient Type: A     SURGEON: Amado Nash MD  ASSISTANT:  Finis Bud PA     Anna Lamb's assistance was necessary as there was no qualified orthopedic  resident available and her assistance was essential in obtaining and  maintaining the reduction as well as during the fixation portion of the  procedure.     PREOPERATIVE DIAGNOSIS:  Left trimalleolar ankle fracture.     POSTOPERATIVE DIAGNOSIS:  Left trimalleolar ankle fracture.     TITLE OF PROCEDURE:  1.  Open reduction internal fixation, left trimalleolar ankle without  posterior fixation, 66440.  2.  Application of stress for fluoroscopy, [id number].     ANESTHESIA:  General plus regional block.     ESTIMATED BLOOD LOSS:  Minimal.     COMPLICATIONS:  None.     INDICATIONS:  This is a 47 year old female who sustained the above injury.  I discussed  with her and her mother at length the indications, risks, benefits of  surgical intervention.  We talked about the anticipated healing and  rehabilitative course.  I answered all their questions, and patient  consents to proceed.     DESCRIPTION OF PROCEDURE:  The patient was taken to the operating room, placed on the operating table  in supine position.  After anesthesia was administered, a surgical timeout  was performed.  The left leg was prepped and draped in usual sterile  fashion.  A tourniquet had been applied.  The leg was elevated,  exsanguinated with an Esmarch bandage, tourniquet was raised to 300 mmHg.   A longitudinal incision was carried out over the distal fibula.  Sharp  dissection was carried down to the fascia.  The fascia was incised, at  which point, the muscles were spread anteriorly and posteriorly and then  the superficial peroneal nerve had been kept with the full-thickness skin  flaps and then a full-thickness subperiosteal dissection was performed to  the lateral face only of the fibula.       The fracture was cleaned of overhanging periosteum and callus, at  which  point it was clamped and reduced anatomically.  It was provisionally fixed  into place with a Synthes 2.7 mm screw in lag fashion.  There was a  separate avulsed fragment from the anterior aspect of the distal fibula  attached to the anterior and inferior tibiofibular ligament.  This was  clamped and reduced anatomically.  It was provisionally held into place  with a Synthes 2 mm mini fragment plate.  X-rays confirmed nice restoration  of fibular length.  At that point, the fracture was definitively fixed with  a Synthes small fragment tubular plate in neutralization mode.  X-rays  confirmed appropriate reduction and placement and size of hardware.       At that point, attention was turned medially and a curvilinear incision was  carried out over the anteromedial aspect of the ankle.  Saphenous vein was  protected.  Full-thickness skin flap was elevated.  The fracture was  cleaned of interposed callus hematoma and irrigated.  The wound was further  irrigated.  At that point, the medial malleolus was clamped and reduced  anatomically.  It was fixed into place with 2 retrograde Synthes small  fragment screws.  The clamp was removed.  X-rays show that the fracture was  well reduced.  The hardware was appropriately sized and positioned.  At  that point, an external rotation stress test as  well as a cotton test were  performed and demonstrated that the mortise was symmetric, the syndesmosis  was stable.  Tourniquet was let down at 60 minutes.  Hemostasis was  achieved.  The wound was copiously irrigated and then the wounds were  closed with 2-0 Monocryl sutures in the fascial layers and subcutaneous  tissue layers.  Skin was closed with vertical mattress 3-0 nylon sutures.   Sterile dressings were applied.  The patient was placed in a short leg AO  splint.  She tolerated the procedure well with no complication.     POSTOPERATIVE DISPOSITION:  The patient will be nonweightbearing to this lower extremity for 6  weeks.   We will plan to start range of motion in the outpatient setting once the  soft tissue envelope is healed, then convert her to a fracture boot.           D:  02/03/2018 12:53 PM by Dr. Amado Nash, MD ([id number])  T:  02/04/2018 08:45 AM by NTS      (Conf: [id number]) (Doc ID: [id number])

## 2018-02-06 ENCOUNTER — Ambulatory Visit (INDEPENDENT_AMBULATORY_CARE_PROVIDER_SITE_OTHER): Payer: BLUE CROSS/BLUE SHIELD | Admitting: Orthopaedic Surgery

## 2018-02-06 ENCOUNTER — Encounter: Payer: Self-pay | Admitting: Orthopaedic Surgery

## 2018-02-08 ENCOUNTER — Encounter (INDEPENDENT_AMBULATORY_CARE_PROVIDER_SITE_OTHER): Payer: Self-pay | Admitting: Orthopaedic Surgery

## 2018-02-08 NOTE — Progress Notes (Signed)
Forms received from patient for work accommodations.  Completed and scanned to patient chart.  Spoke with patient who is made aware and requesting forms be sent to employer.  Faxed to [employer] at [phone]  Confirmation received.

## 2018-02-20 ENCOUNTER — Encounter (INDEPENDENT_AMBULATORY_CARE_PROVIDER_SITE_OTHER): Payer: Self-pay | Admitting: Orthopaedic Surgery

## 2018-02-20 ENCOUNTER — Ambulatory Visit (INDEPENDENT_AMBULATORY_CARE_PROVIDER_SITE_OTHER): Payer: BLUE CROSS/BLUE SHIELD | Admitting: Orthopaedic Surgery

## 2018-02-20 ENCOUNTER — Ambulatory Visit (INDEPENDENT_AMBULATORY_CARE_PROVIDER_SITE_OTHER): Payer: BLUE CROSS/BLUE SHIELD

## 2018-02-20 VITALS — BP 120/75 | HR 94 | Ht 66.0 in | Wt 190.0 lb

## 2018-02-20 DIAGNOSIS — S82852A Displaced trimalleolar fracture of left lower leg, initial encounter for closed fracture: Secondary | ICD-10-CM

## 2018-02-20 NOTE — Progress Notes (Signed)
Orthpaedic Trauma Follow Up Visit    Chief Complaint   Patient presents with   . Ankle Injury     2 wks s/p LEFT ORIF, ANKLE, TRIMALLEOLAR DOS 02/03/18       HPI: 47 year old female who is now 2 weeks status post open reduction internal fixation of left trimalleolar ankle fracture.  Patient reports she has been in a splint and doing well.  She reports that her pain is well controlled.  She has been compliant with nonweightbearing restriction.  She has no real issues at this point.    PE:   Vitals:    02/20/18 1347   BP: 120/75   Pulse: 94     Left lower extremity exam is notable for incisions are clean dry and intact over the medial lateral ankle.  The patient's range of motion of the ankle is somewhat limited to pain.  She is tender palpation of the lateral medial ankle.  Sensation is intact light touch in the SP, DP, tibial nerve distribution.  There are some mild paresthesias in the SP distribution.  Her toes are warm well perfused    Radiology: Xrays taken in clinic today: AP, lateral, and mortise views of the left ankle were obtained today in clinic to evaluate fracture healing and alignment.  They demonstrate stable alignment of a left trimalleolar ankle fracture with plate and screw construct in the lateral ankle and screws in the medial malleolus.  Overall impression is normal alignment and routine healing of a left trimalleolar ankle fracture.    Impression: 47 year old female who is now 2 weeks status post open reduction internal fixation of left trimalleolar ankle fracture.    Plan: At today's visit, the patient's sutures were removed.  The patient will begin range of motion in her left ankle.  We have provided her with a referral to PT reflect these changes.  She will remain nonweightbearing on her left lower extremity.  We have given her a cam boot.  We will see her back in 4 weeks for AP, lateral, and mortise views of the left ankle and clinical exam.  We will likely advance her weightbearing status at  that time.    Attending Addendum/Attestation:    I have personally seen and examined this patient and have participated in their care. I agree with the clinical information, including the physical exam, patient history, and planning as documented by the Resident or Physician Assistant. In addition, I have edited this note to reflect my findings and plan as well as to incorporate any new data.      Emiliano Dyer, MD  Orthopaedic Trauma  Pager [phone]  Office [phone]    This note was generated within the EPIC EMR using Dragon medical speech recognition software and may contain inherent errors or omissions not intended by the user. Grammatical and punctuation errors, random word insertions, deletions, pronoun errors and incomplete sentences are occasional consequences of this technology due to software limitations. Not all errors are caught or corrected.  Although every attempt is made to root out erroneus and incomplete transcription, the note may still not fully represent the intent or opinion of the author. If there are questions or concerns about the content of this note or information contained within the body of this dictation they should be addressed directly with the author for clarification.

## 2018-02-22 ENCOUNTER — Inpatient Hospital Stay: Payer: BLUE CROSS/BLUE SHIELD | Attending: Orthopaedic Surgery

## 2018-02-22 VITALS — BP 120/73

## 2018-02-22 DIAGNOSIS — M25571 Pain in right ankle and joints of right foot: Secondary | ICD-10-CM | POA: Insufficient documentation

## 2018-02-22 NOTE — PT/OT Therapy Note (Signed)
Name: [name]  Referring Physician: Candyce Churn, MD  Diagnosis:    ICD-10-CM    1. Pain in joint, ankle and foot, right M25.571        Precautions:  NWB, please see protocol in referral Date of Surgery:  s/p LEFT ORIF, ANKLE, TRIMALLEOLAR DOS 02/03/18 MD Follow-up:  03/20/2018          Exercise Flow Sheet    Exercise Specifics         PN   SLR                 Sidelying hip abduction             Ankle 4 way                                                                                                                                       Home Exercise Program               (Initials = supervised exercise by clinician)  (White date box = 2 week treatment reminder)    Access Code: [email or web address]   URL: [email or web address]   Date: 02/22/2018   Prepared by: Donald Siva     Exercises   Seated Ankle Pumps on Table - 20 reps - 3 sets - 3x daily - 7x weekly   Supine Ankle Circles - 10 reps - 3 sets - 2x daily - 7x weekly   Seated Calf Stretch with Strap - 2 sets - 30 hold - 1x daily - 7x weekly   Supine Active Straight Leg Raise - 15 reps - 2 sets - 1x daily - 7x weekly   Sidelying Hip Abduction - 15 reps - 2 sets - 1x daily - 7x weekly

## 2018-02-22 NOTE — Progress Notes (Addendum)
Name: Anna Lamb Age: 47 y.o.   Referring Physician: Candyce Churn, MD   Date of Injury: 02/03/2018  Date Care Plan Established/Reviewed: 02/22/2018  Date Treatment Started: 02/22/2018  Visit Count: 1   Diagnosis:   1. Pain in joint, ankle and foot, right        Subjective     History of Present Illness   History of Present Illness: Anna Lamb down the stairs on June 4th, 2019 and sustained a trimalleolar fracture. ORIF on 14th of June.     FU with physician 2 weeks post op, stated everything looked good. Likely to be NWB for 4 more weeks.    Overall, pain is managed. Feels stiff and restless particularly at night.    Has not been moving her ankle much, and is scared to do so.   Functional Limitations (PLOF): Unable to walk any distance, currently due to protocol (> 1 hour prior)  Unable to stand any amount of time, currently due to protocol (> 1 hour prior)    Outcome Measure   Tool Used/Details: FOTO  Score: 28  Predicted Functional Outcome: 53    Objective     Gait   NWB, arrived on scooter in CAM boot    Integumentary   surgical incisions healing without signs of infection    Range of Motion     02/22/2018    Left AROM 02/22/2018   Left PROM   Ankle/Foot 02/22/2018    Right AROM 02/22/2018   Right PROM   neutral  5  Dorsiflexion 12      30  40  Plantarflexion 45      30, P!  35  Ankle Inversion 35      10,P!  12  Ankle Eversion 16      (blank fields were intentionally left blank)  DF measured with knee in extension    Strength     02/22/2018   Left Strength  Hip  MMT 02/22/2018   Right     Hip Flexion       Hip Extension     4  Hip Abduction 4      Hip Adduction       Hip IR       Hip ER     4  Quadriceps 4    4  Hamstrings 4    (blank fields were intentionally left blank)  Deferred formal testing of ankle musculature in order to respect soft tissue healing. Will reassess at a later date     Palpation ttp along 5th metatarsal, lateral ankle (global)  Grade 2 pitting edema present along entire foot (more pronouced  laterally)    Joint Mobility   Left Ankle/Foot  Hypomobile in the midfoot.     Flexibility + for gastroc and soleus tightness on L    Neurological Testing     Sensation     Ankle/Foot   Left Ankle/Foot   Intact: light touch    Right Ankle/Foot   Intact: light touch     Swelling   Left Ankle/Foot   Figure 8: 56 cm    Right Ankle/Foot   Figure 8: 54 cm      BP: 120/73      Treatment     Therapeutic Exercises   Justification: To improve strength, stabilization, and ROM/flexibility  Today and HEP:   Access Code: [email or web address]   URL: [email or web address]   Date: 02/22/2018   Prepared by: Donald Siva  Exercises Seated Ankle Pumps on Table - 20 reps - 3 sets - 3x daily - 7x weekly   Supine Ankle Circles - 10 reps - 3 sets - 2x daily - 7x weekly   Seated Calf Stretch with Strap - 2 sets - 30 hold - 1x daily - 7x weekly   Supine Active Straight Leg Raise - 15 reps - 2 sets - 1x daily - 7x weekly   Sidelying Hip Abduction - 15 reps - 2 sets - 1x daily - 7x weekly       Importance of HEP being painfree was emphasized    Manual Therapy   Justification: To improve joint mobility and reduce soft tissue restrictions that impede movement and inhibit active motion   Retrograde edema massage  Gentle manual DF ROM with STM along calf musculature        ---      ---   Total Time   Timed Minutes  16 minutes   Untimed Minutes  18 minutes   Total Time  34 minutes        Assessment   Anna Lamb is a 47 y.o. female presenting with L ankle dysfunction following ORIF on 02/03/2018 who requires Physical Therapy for the following:  Impairments: ankle ROM (all planes), functional LE strength, gait biomechanics, balance/proprioception,and transfer mechanics      Pain located: Global along L ankle (most notable lateral side of ankle and foot)    Clinical presentation: stable (presenting as expected)  Barriers to therapy: Kinesiophobia (may impact exercise tolerance   Prior Level of Function: Unable to walk any distance, currently  due to protocol (> 1 hour prior)  Unable to stand any amount of time, currently due to protocol (> 1 hour prior)  Prognosis: good  Plan   Visits per week: 2  Number of Sessions: 18  Direct One on One  57846: Therapeutic Exercise: To Develop Strength and Endurance, ROM and Flexibility  [id number]: Gait Training  96295: Neuromuscular Reeducation  97140: Manual Therapy techniques (mobilization, manipulation, manual traction) (STM to ankle/foot complex, Grades I-IV mobs to ankle/foot complex as indicated per protocol and healing times)  97530: Therapeutic Activities: Dynamic activities to improve functional performance  Dry Needling  28413: Aquatic Therapy  Supervised Modalities  97010: Thermal modalities: hot/cold packs  24401: Vasopneumatic devices  Check response to HEP  Address soft tissue/ joint restrictions as appropriate (via healing times and protocol) to restore ROM      Goals    Goal 1:  Patient to demonstrate independence and report compliance with HEP at least 5x/week      Sessions:  18      Goal 2:  Patient to demonstrated at least 10 d of DF in order to walk at least 30 minutes without compensatory strategy (when appropriate via protocol)    Sessions:  18      Goal 3:  Patient to achieve predicted FOTO score of 53 in order to maximize functional recovery      Sessions:  18      Goal 4:  Patient to demonstrate 5/5 strength in L ankle musculature in order to be able to walk without compensatory strategy    Sessions:  [address]                                Donald Siva, PT

## 2018-02-22 NOTE — PT/OT Therapy Note (Deleted)
Name: DOREE KUEHNE Age: 47 y.o.   Referring Physician: Candyce Churn, MD   Date of Injury: No data was found  Date Care Plan Established/Reviewed: No data was found  Date Treatment Started: No data was found  Visit Count: Visit count could not be calculated. Make sure you are using a visit which is associated with an episode.   Diagnosis: No diagnosis found.    Subjective     History of Present Illness   History of Present Illness: Larey Seat down the stairs January 24, 2018 after missing a step.                  Treatment     Therapeutic Exercises   Justification: To improve strength, stabilization, and ROM/flexibility  Today and HEP:     Access Code: [email or web address]   URL: [email or web address]   Date: 02/22/2018   Prepared by: Donald Siva      Exercises Seated Ankle Pumps on Table - 20 reps - 3 sets - 3x daily - 7x weekly   Supine Ankle Circles - 10 reps - 3 sets - 2x daily - 7x weekly   Seated Calf Stretch with Strap - 2 sets - 30 hold - 1x daily - 7x weekly   Supine Active Straight Leg Raise - 15 reps - 2 sets - 1x daily - 7x weekly   Sidelying Hip Abduction - 15 reps - 2 sets - 1x daily - 7x weekly                   Donald Siva, PT

## 2018-02-27 ENCOUNTER — Inpatient Hospital Stay: Payer: BLUE CROSS/BLUE SHIELD | Attending: Orthopaedic Surgery

## 2018-02-27 ENCOUNTER — Inpatient Hospital Stay: Payer: BLUE CROSS/BLUE SHIELD

## 2018-02-27 DIAGNOSIS — M25571 Pain in right ankle and joints of right foot: Secondary | ICD-10-CM | POA: Insufficient documentation

## 2018-02-27 NOTE — PT/OT Therapy Note (Signed)
Name: Anna Lamb Age: 47 y.o.   Referring Physician: Candyce Churn, MD   Date of Injury: 02/03/2018  Date Care Plan Established/Reviewed: 02/22/2018  Date Treatment Started: 02/22/2018  Visit Count: 2   Diagnosis:   1. Pain in joint, ankle and foot, right        Subjective     Pain   No pain reported    Pt states that the ankle is doing pretty well. Doing the exercises at home and they feel good. Still has the strips on the side of the ankle but the other ones are gone. F/u appointment with surgeon at the end of the month.         Initial Evaluation Reference and/orCurrent Measurements (ROM, Strength, Girth, Outcomes, etc.):                Range of Motion     02/22/2018 L AROM  02/22/2018 L PROM    Ankle/Foot 02/22/2018 R AROM    neutral 5 Dorsiflexion 12   30 40 Plantarflexion 45   30, P! 35 Ankle Inversion 35   10,P! 12 Ankle Eversion 16   (blank fields were intentionally left blank)  DF measured with knee in extension    Strength     02/22/2018 L  Strength  Hip  MMT 02/22/2018 R    Hip Flexion     Hip Extension    4 Hip Abduction 4    Hip Adduction     Hip IR     Hip ER    4 Quadriceps 4   4 Hamstrings 4   (blank fields were intentionally left blank)  Deferred formal testing of ankle musculature in order to respect soft tissue healing. Will reassess at a later date     Treatment     Therapeutic Exercises   Justification: To improve:Flexibility/ROM, Stabilization and Strength   Subjective obtained while walking to plinth    Review and completion of HEP exercises to ensure proper form.     Initiation of therex as per flowsheet.     Manual stretching of the R ankle into DF         Neuromuscular Re-Education   Justification: For activation and/or inhibition of target muscle, to improve balance and proprioception  Tactile cuing to forward drift of leg with s/l hip abd    Tactile cuing to increase quad activation with SLR with increased DF    Tactile cuing to prevent hip compensations with 4 way ankle.     Manual  Therapy   Justification: To improve joint mobility, soft tissue mobility, and reduce trigger points  Supine  Scar mobilizations to all incision sites   Gentle TC AP mobs gr 2-3     Modalities   Ice to L foot in supine with LE elevated on bolster x10' to decrease post exercise soreness and inflammation.        ---      ---   Total Time   Timed Minutes  41 minutes   Total Time  41 minutes        Assessment   Pt tolerated session well. Time was taken to complete and correct HEP. Encouraged pt to attempt to remove steri strips gently due to them being in place for over a week and incision healing seen. Pt removed in clinic and no ill effect seen. Proceeded to complete scar mobilizations to well healed portions of the incisions- advised pt putting a band aid on distal medial  incision until completely healed as some gapping in the incision was noted and pt demonstrated understanding. Able to perform all exercises without discomfort reported. Ended session with ice and elevation to decrease post exercise inflammation and soreness.   Plan   Cont per POC.   F/u with pt in regards to tolerance to today's session.       Goals    Goal 1:  Patient to demonstrate independence and report compliance with HEP at least 5x/week      Sessions:  18      Goal 2:  Patient to demonstrated at least 10 d of DF in order to walk at least 30 minutes without compensatory strategy (when appropriate via protocol)    Sessions:  18      Goal 3:  Patient to achieve predicted FOTO score of 53 in order to maximize functional recovery      Sessions:  18      Goal 4:  Patient to demonstrate 5/5 strength in L ankle musculature in order to be able to walk without compensatory strategy    Sessions:  18                                Tadarius Maland, PTA       Name: [name]  Referring Physician: Candyce Churn, MD  Diagnosis:  1. Pain in joint, ankle and foot, right          Precautions:  NWB, please see protocol in referral Date of Surgery:  s/p LEFT  ORIF, ANKLE, TRIMALLEOLAR DOS 02/03/18 MD Follow-up:  03/20/2018          Exercise Flow Sheet    Exercise Specifics 02/27/18        PN   SLR    2x15 AT HEP              Sidelying hip abduction  2x15 AT           Ankle 4 way                   Seated calf stretch 3x30" AT HEP                                                                                                                     Home Exercise Program               (Initials = supervised exercise by clinician)  (White date box = 2 week treatment reminder)    Access Code: [email or web address]   URL: [email or web address]   Date: 02/22/2018   Prepared by: Donald Siva     Exercises   Seated Ankle Pumps on Table - 20 reps - 3 sets - 3x daily - 7x weekly   Supine Ankle Circles - 10 reps - 3 sets - 2x daily - 7x weekly   Seated Calf Stretch with Strap - 2 sets - 30 hold - 1x  daily - 7x weekly   Supine Active Straight Leg Raise - 15 reps - 2 sets - 1x daily - 7x weekly   Sidelying Hip Abduction - 15 reps - 2 sets - 1x daily - 7x weekly

## 2018-03-02 ENCOUNTER — Inpatient Hospital Stay: Payer: BLUE CROSS/BLUE SHIELD | Attending: Orthopaedic Surgery

## 2018-03-02 DIAGNOSIS — M25571 Pain in right ankle and joints of right foot: Secondary | ICD-10-CM | POA: Insufficient documentation

## 2018-03-02 NOTE — PT/OT Therapy Note (Signed)
Name: Anna Lamb Age: 47 y.o.   Referring Physician: Candyce Churn, MD   Date of Injury: 02/03/2018  Date Care Plan Established/Reviewed: 02/22/2018  Date Treatment Started: 02/22/2018  Visit Count: 3   Diagnosis:   1. Pain in joint, ankle and foot, right        Subjective     Pain   No pain reported    Pain is overall pretty good. Did accidentally WB a couple of times and pain increased.   Reports regular compliance with HEP    Objective     03/02/2018 Figure 53 cm          Initial Evaluation Reference and/orCurrent Measurements (ROM, Strength, Girth, Outcomes, etc.):                Range of Motion     02/22/2018 L AROM  02/22/2018 L PROM  03/02/2018     Ankle/Foot 02/22/2018 R AROM    neutral 5 AROM:5  PROM:8 Dorsiflexion 12   30 40 -- Plantarflexion 45   30, P! 35 -- Ankle Inversion 35   10,P! 12 -- Ankle Eversion 16   (blank fields were intentionally left blank)  DF measured with knee in extension    Strength     02/22/2018 L  Strength  Hip  MMT 02/22/2018 R   4 Hip Abduction 4   4 Quadriceps 4   4 Hamstrings 4   (blank fields were intentionally left blank)  Deferred formal testing of ankle musculature in order to respect soft tissue healing. Will reassess at a later date     Treatment     Therapeutic Exercises   Justification: To improve:Flexibility/ROM, Stabilization and Strength   Subjective obtained while walking to plinth    Initiation of therex as per flowsheet.     Review of HEP in entirety and importance of regaining DF prior to clearance for walking     Manual movement of the R ankle into all planes (no holds)        Neuromuscular Re-Education   Justification: For activation and/or inhibition of target muscle, to improve balance and proprioception  Tactile cuing to forward drift of leg with s/l hip abd    Tactile cuing to increase quad activation with SLR with increased DF    Tactile cuing to prevent hip compensations with ankle circles     Manual Therapy   Justification: To improve joint mobility, soft tissue  mobility, and reduce trigger points  Supine  STM along calf with low load long duration stretch into DF       Modalities   Ice to L foot in supine with LE elevated on bolster x10' to decrease post exercise soreness and inflammation.        ---      ---   Total Time   Timed Minutes  46 minutes   Total Time  46 minutes        Assessment   Pt tolerated session well. Small improvements in swelling and ROM noted since beginning PT.     Patient reports not being fully compliant with WB precautions. Emphasized the importance of maintain WB precautions in order to maximize LT outcomes.    Needs continued work on improving DF in order to prepare for WB (when cleared by physician).       Plan   Cont per POC.   Continue to monitor ROM and swelling each session  (based on progress consider 1x/week)  Goals    Goal 1:  Patient to demonstrate independence and report compliance with HEP at least 5x/week      Sessions:  18      Goal 2:  Patient to demonstrated at least 10 d of DF in order to walk at least 30 minutes without compensatory strategy (when appropriate via protocol)    Sessions:  18      Goal 3:  Patient to achieve predicted FOTO score of 53 in order to maximize functional recovery      Sessions:  18      Goal 4:  Patient to demonstrate 5/5 strength in L ankle musculature in order to be able to walk without compensatory strategy    Sessions:  18                                Donald Siva, PT       Name: Anna Lamb  Referring Physician: Candyce Churn, MD  Diagnosis:  1. Pain in joint, ankle and foot, right          Precautions:  NWB, please see protocol in referral Date of Surgery:  s/p LEFT ORIF, ANKLE, TRIMALLEOLAR DOS 02/03/18 MD Follow-up:  03/20/2018          Exercise Flow Sheet    Exercise Specifics 02/27/18 03/02/2018       PN   SLR    2x15 AT HEP  3 x 15 -JB            Sidelying hip abduction  2x15 AT 3 x 15-JB          Ankle 4 way     Ankle circles 2 x 20 reps each direction-JB              Seated  calf stretch 3x30" AT HEP  3 x 30'' -JB                                                                                                                   Home Exercise Program     Reviewed-JB          (Initials = supervised exercise by clinician)  (White date box = 2 week treatment reminder)    Access Code: [email or web address]   URL: [email or web address]   Date: 02/22/2018   Prepared by: Donald Siva     Exercises   Seated Ankle Pumps on Table - 20 reps - 3 sets - 3x daily - 7x weekly   Supine Ankle Circles - 10 reps - 3 sets - 2x daily - 7x weekly   Seated Calf Stretch with Strap - 2 sets - 30 hold - 1x daily - 7x weekly   Supine Active Straight Leg Raise - 15 reps - 2 sets - 1x daily - 7x weekly   Sidelying Hip Abduction - 15 reps - 2 sets - 1x daily - 7x weekly

## 2018-03-07 ENCOUNTER — Inpatient Hospital Stay: Payer: BLUE CROSS/BLUE SHIELD | Attending: Orthopaedic Surgery

## 2018-03-07 DIAGNOSIS — M25571 Pain in right ankle and joints of right foot: Secondary | ICD-10-CM | POA: Insufficient documentation

## 2018-03-07 NOTE — PT/OT Therapy Note (Signed)
Name: Anna Lamb Age: 47 y.o.   Referring Physician: Candyce Churn, MD   Date of Injury: 02/03/2018  Date Care Plan Established/Reviewed: 02/22/2018  Date Treatment Started: 02/22/2018  Visit Count: 4   Diagnosis:   1. Pain in joint, ankle and foot, right        Subjective     Pain   No pain reported    Pt reports she has been having difficulty not putting weight through the L LE. Pt states the scar has been very sensitive.     Objective     03/02/2018 Figure 53 cm          Initial Evaluation Reference and/orCurrent Measurements (ROM, Strength, Girth, Outcomes, etc.):                Range of Motion     02/22/2018 L AROM  02/22/2018 L PROM  03/02/2018     Ankle/Foot 02/22/2018 R AROM    neutral 5 AROM:5  PROM:8 Dorsiflexion 12   30 40 -- Plantarflexion 45   30, P! 35 -- Ankle Inversion 35   10,P! 12 -- Ankle Eversion 16   (blank fields were intentionally left blank)  DF measured with knee in extension    Strength     02/22/2018 L  Strength  Hip  MMT 02/22/2018 R   4 Hip Abduction 4   4 Quadriceps 4   4 Hamstrings 4   (blank fields were intentionally left blank)  Deferred formal testing of ankle musculature in order to respect soft tissue healing. Will reassess at a later date     Treatment     Therapeutic Exercises   Justification: To improve:Flexibility/ROM, Stabilization and Strength   Use of recumbent bike for increased blood flow and with subjective taken to determine course of treatment progression.   See exercise flowsheet.   Manual movement of the R ankle into all planes.   Continued use of 4 way hip strengthening exercises to prevent atrophy in hip when cleared for WB.         Neuromuscular Re-Education   Justification: For activation and/or inhibition of target muscle, to improve balance and proprioception  Use of SL clams for increased glut activation and isometric hold with resistance band       Manual Therapy   Justification: To improve joint mobility, soft tissue mobility, and reduce trigger points  Supine: STM  along calf with low load long duration stretch into DF   Scar desensitization with use of towel and fingers.          ---      ---   Total Time   Timed Minutes  43 minutes   Total Time  43 minutes        Assessment     Patient continues to report not being fully compliant with WB precautions. Emphasized again the importance of maintain WB precautions in order to maximize L LE outcomes and regain functional motion. Pt was educated on healing time frame and potential delayed healing if she continued to WB. Cleared patient to perform recumbent bike at gym with use of CAM boot. Advised patient to begin working on desensitization of scar at home with different textures to reduce "sharp pain" with touching scar. Pt continues to need skilled therapy to work on improving DF in order to prepare for WB. PT has follow up with physician next Friday, dropping to 1x/week for next week until cleared by physician.  Plan   Cont per POC.   Continue to monitor ROM and swelling each session. Next session drop to 1x/week. Increase back to 2x/week if indicated after cleared for WB.       Goals    Goal 1:  Patient to demonstrate independence and report compliance with HEP at least 5x/week      Sessions:  18      Goal 2:  Patient to demonstrated at least 10 d of DF in order to walk at least 30 minutes without compensatory strategy (when appropriate via protocol)    Sessions:  18      Goal 3:  Patient to achieve predicted FOTO score of 53 in order to maximize functional recovery      Sessions:  18      Goal 4:  Patient to demonstrate 5/5 strength in L ankle musculature in order to be able to walk without compensatory strategy    Sessions:  18                                Anna Lamb, PT, DPT       Name: Anna Lamb  Referring Physician: Candyce Churn, MD  Diagnosis:  1. Pain in joint, ankle and foot, right          Precautions:  NWB, please see protocol in referral Date of Surgery:  s/p LEFT ORIF, ANKLE, TRIMALLEOLAR DOS  02/03/18 MD Follow-up:  03/20/2018          Exercise Flow Sheet    Exercise Specifics 02/27/18 03/02/2018 03/07/18      PN   SLR    2x15 AT HEP  3 x 15 -JB 3x15  AH           Sidelying hip abduction  2x15 AT 3 x 15-JB 3x15   AH          Ankle 4 way     Ankle circles 2 x 20 reps each direction-JB 2x20 reps  AH              Seated calf stretch 3x30" AT HEP  3 x 30'' -JB MT  AH                SL clams   GTB  2x10x10 sec  AH                  Prone hip ext  2x20   AH                                                                            Rec bike  6 min  AH          Home Exercise Program     Reviewed-JB          (Initials = supervised exercise by clinician)  (White date box = 2 week treatment reminder)    Access Code: [email or web address]   URL: [email or web address]   Date: 02/22/2018   Prepared by: Donald Siva     Exercises   Seated Ankle Pumps on Table - 20 reps - 3 sets - 3x daily - 7x weekly  Supine Ankle Circles - 10 reps - 3 sets - 2x daily - 7x weekly   Seated Calf Stretch with Strap - 2 sets - 30 hold - 1x daily - 7x weekly   Supine Active Straight Leg Raise - 15 reps - 2 sets - 1x daily - 7x weekly   Sidelying Hip Abduction - 15 reps - 2 sets - 1x daily - 7x weekly

## 2018-03-09 ENCOUNTER — Inpatient Hospital Stay: Payer: BLUE CROSS/BLUE SHIELD

## 2018-03-14 ENCOUNTER — Inpatient Hospital Stay: Payer: BLUE CROSS/BLUE SHIELD | Attending: Orthopaedic Surgery

## 2018-03-14 DIAGNOSIS — M25571 Pain in right ankle and joints of right foot: Secondary | ICD-10-CM | POA: Insufficient documentation

## 2018-03-14 NOTE — PT/OT Therapy Note (Signed)
Name: Anna Lamb Age: 47 y.o.   Referring Physician: Candyce Churn, MD   Date of Injury: 02/03/2018  Date Care Plan Established/Reviewed: 02/22/2018  Date Treatment Started: 02/22/2018  Visit Count: 5   Diagnosis:   1. Pain in joint, ankle and foot, right        Subjective     Pain   No pain reported    Pt reports she is still putting weight through the [name] - states "its just too hard" and that she has ditched the crutches. Pt states she is still having the hardest time with falling asleep at night and that she has been needing to take muscle relaxers and gabapentin.     Objective     03/02/2018 Figure 53 cm          Initial Evaluation Reference and/orCurrent Measurements (ROM, Strength, Girth, Outcomes, etc.):                Range of Motion     02/22/2018 L AROM  02/22/2018 L PROM  03/02/2018     Ankle/Foot 02/22/2018 R AROM  03/14/18 L AROM   neutral 5 AROM:5  PROM:8 Dorsiflexion 12 0   30 40 -- Plantarflexion 45 45   30, P! 35 -- Ankle Inversion 35 30   10,P! 12 -- Ankle Eversion 16 10   (blank fields were intentionally left blank)  DF measured with knee in extension    Strength     02/22/2018 L  Strength  Hip  MMT 02/22/2018 R   4 Hip Abduction 4   4 Quadriceps 4   4 Hamstrings 4   (blank fields were intentionally left blank)  Deferred formal testing of ankle musculature in order to respect soft tissue healing. Will reassess at a later date     Treatment     Therapeutic Exercises   Justification: To improve:Flexibility/ROM, Stabilization and Strength   Use of recumbent bike for increased blood flow and with subjective taken to determine course of treatment progression.   See exercise flowsheet.   Manual movement of the R ankle into all planes.   Continued use of 4 way hip strengthening exercises to prevent atrophy in hip when cleared for WB.   Use of SL clams for increased glut activation and isometric hold with resistance band         Manual Therapy   Justification: To improve joint mobility, soft tissue mobility,  and reduce trigger points  Supine: STM along calf with low load long duration stretch into DF   Scar desensitization with use of towel and fingers.       Therapeutic Activity   Justification: To improve quality of sleep.     Therapeutic Activities - Sleep Positions:   Educated patient on proper sleep positions to avoid  pain by utilizing pillow between knees and placing small  to equally distribute body weight and reduce mechanical forcewhen side lying and to place pillow under knees when in supine. Educated patient on pillow height to to make up distance between shoulder and ear to prevent side bending during sleep in side lying and to promote neutral cervical spine when supine. Each position demonstrated and practiced.          ---      ---   Total Time   Timed Minutes  40 minutes   Total Time  40 minutes        Assessment   Discussion with patient regarding 1x/week until after  Friday MD appt. Pt continues to state she is non-compliant with NWB precautions due to it being too hard. Pt has decreased sensitivity to scar during today's treatment session demonstrating use of desensitization techniques used at home. Pt was progressed to standing hip 4 way for increased hip stability bilateral.   Plan   Cont per POC.   Continue to monitor ROM and swelling each session. Next session drop to 1x/week. Increase back to 2x/week if indicated after cleared for WB.   Advised pt to schedule 2x/week through August - may drop back to 1x/week if continued NWB.       Goals    Goal 1:  Patient to demonstrate independence and report compliance with HEP at least 5x/week      Sessions:  18      Goal 2:  Patient to demonstrated at least 10 d of DF in order to walk at least 30 minutes without compensatory strategy (when appropriate via protocol)    Sessions:  18      Goal 3:  Patient to achieve predicted FOTO score of 53 in order to maximize functional recovery      Sessions:  18      Goal 4:  Patient to demonstrate 5/5 strength in L  ankle musculature in order to be able to walk without compensatory strategy    Sessions:  18                                Ellery Tash B Carmela Piechowski, PT, DPT       Name: Anna Lamb  Referring Physician: Candyce Churn, MD  Diagnosis:  1. Pain in joint, ankle and foot, right          Precautions:  NWB, please see protocol in referral Date of Surgery:  s/p LEFT ORIF, ANKLE, TRIMALLEOLAR DOS 02/03/18 MD Follow-up:  03/20/2018          Exercise Flow Sheet    Exercise Specifics 02/27/18 03/02/2018 03/07/18 03/14/18     PN   SLR    2x15 AT HEP  3 x 15 -JB 3x15  AH --          Sidelying hip abduction  2x15 AT 3 x 15-JB 3x15   AH  --        Ankle 4 way     Ankle circles 2 x 20 reps each direction-JB 2x20 reps  AH  Ankle alphabet  x2 ea  AH             Seated calf stretch 3x30" AT HEP  3 x 30'' -JB MT  AH  pirformis stretch supine  3x30 sec  AH               SL clams   GTB  2x10x10 sec  AH    GTB  clams with hip abd  2x10   AH               Prone hip ext  2x20   AH  x20x10 sec  AH     x20 at EOB  AH                Stand hip 4 way on R Anna  x20 flex, abd, ext  Rec bike  6 min  AH  6 min  AH         Home Exercise Program     Reviewed-JB          (Initials = supervised exercise by clinician)  (White date box = 2 week treatment reminder)    Access Code: [email or web address]   URL: [email or web address]   Date: 02/22/2018   Prepared by: Donald Siva     Exercises   Seated Ankle Pumps on Table - 20 reps - 3 sets - 3x daily - 7x weekly   Supine Ankle Circles - 10 reps - 3 sets - 2x daily - 7x weekly   Seated Calf Stretch with Strap - 2 sets - 30 hold - 1x daily - 7x weekly   Supine Active Straight Leg Raise - 15 reps - 2 sets - 1x daily - 7x weekly   Sidelying Hip Abduction - 15 reps - 2 sets - 1x daily - 7x weekly

## 2018-03-16 ENCOUNTER — Inpatient Hospital Stay: Payer: BLUE CROSS/BLUE SHIELD

## 2018-03-17 ENCOUNTER — Encounter (INDEPENDENT_AMBULATORY_CARE_PROVIDER_SITE_OTHER): Payer: Self-pay | Admitting: Physician Assistant

## 2018-03-17 ENCOUNTER — Ambulatory Visit (INDEPENDENT_AMBULATORY_CARE_PROVIDER_SITE_OTHER): Payer: BLUE CROSS/BLUE SHIELD

## 2018-03-17 ENCOUNTER — Ambulatory Visit (INDEPENDENT_AMBULATORY_CARE_PROVIDER_SITE_OTHER): Payer: BLUE CROSS/BLUE SHIELD | Admitting: Physician Assistant

## 2018-03-17 VITALS — BP 100/76 | HR 93

## 2018-03-17 DIAGNOSIS — S82852A Displaced trimalleolar fracture of left lower leg, initial encounter for closed fracture: Secondary | ICD-10-CM

## 2018-03-17 NOTE — Progress Notes (Signed)
Chief compaint:   Chief Complaint   Patient presents with   . Ankle Injury     6wks s/p LEFT ORIF, ANKLE, TRIMALLEOLAR DOS 02/03/18         The patient is a 47 y.o. female 6 weeks s/p left ORIF of her ankle . The patient has been doing well, her pain is well controlled. Pain is worse with movement and better with rest. She  has been working with physical therapy. She has not been weight bearing on the extremity. She denies numbness or tingling.  Hoping to advance her WB status today.  No new complaints.  Still taking gabapentin and occasional benadryl at night to help with sleeping.      Physical Exam  Patient is a pleasant, well appearing 47 y.o. female who appears her stated age. She is in no acute distress, is awake alert and oriented. She has a regular rate and unlabored breathing. The patient has been non-weight bearing using a roll-about scooter, and is well coordinated.     Vitals:    03/17/18 1111   BP: 100/76   Pulse: 93   SpO2: 97%       Left Lower Extremity:   Incisions are well healed.  Mild post-op edema as expected at this point  No erythema or sign of infection  +PF/DF/EHL                                                                                                                       Wiggles toes well                                                                                                                SILT to Toes                                                                                                                        2+ DP/PT  Cap refill <2 secs    Indication: pain  Radiographs: AP, Lateral and  Oblique views of the  left ankle demonstrate excellent reduction and alignment, appropriate callus formation, well preserved joint spaces and stable, intact hardware.     Assessment: Patient is a 47 y.o. female s/p the above mentioned procedures.     Plan:  1. The patient is weight bearing as tolerated on the Left Lower Extremity  2. The patient should follow up in 6 weeks for and  Xray and exam with Dr. Broadus John  3. PT Rx given.  Help wean boot and gait training.  4. Ok to take ibuprofen or tylenol.  Can try tylenol PM to help with sleeping.       BP 100/76   Pulse 93   SpO2 97%     Review of Systems: Other than mentioned above, there are no Constitutional, Neurological, Psychiatric, ENT, Ophthalmological, Cardiovascular, Respiratory, GI, GU, Musculoskeletal, Integumentary, Lymphatic, Endocrine or Allergic issues.    Past Medical, Social, Surgical and Family history reviewed in the electronic medical record (EPIC) or as below:    Past Medical History:   Diagnosis Date   . Anxiety     oral meds   . Depression     PO Meds   . Fracture 01/2018    left ankle   . Hypothyroidism     oral meds   . Migraine     infrequent   . Post-operative nausea and vomiting        Past Surgical History:   Procedure Laterality Date   . HEMANGIOMA EXCISION  l1970's    lip   . LAPAROSCOPIC, ABDOMINAL EXPLORATION  1990's   . ORIF, ANKLE, TRIMALLEOLAR Left 02/03/2018    Procedure: ORIF, ANKLE, TRIMALLEOLAR;  Surgeon: Amado Nash, MD;  Location: [facility];  Service: Orthopedics;  Laterality: Left;  ORIF LEFT TRIMALLEOLAR ANKLE FRACTURE WITHOUT FIXATION OF THE POSTERIOR LIP   . THYROID SURGERY  2011       Allergies   Allergen Reactions   . Penicillin V Hives   . Penicillin V Potassium Hives   . Sulfa Antibiotics Hives   . Sulfacetamide Hives   . Sulfasalazine Hives   . Iodinated Diagnostic Agents Hives   . Penicillins Hives       No outpatient prescriptions have been marked as taking for the 03/17/18 encounter (Office Visit) with Patriciaann Clan, [place].       Social History   Substance Use Topics   . Smoking status: Current Every Day Smoker     Packs/day: 0.50     Years: 25.00     Types: Cigarettes   . Smokeless tobacco: Never Used   . Alcohol use No       Family History   Problem Relation Age of Onset   . No known problems Mother    . No known problems Father

## 2018-03-20 ENCOUNTER — Ambulatory Visit (INDEPENDENT_AMBULATORY_CARE_PROVIDER_SITE_OTHER): Payer: BLUE CROSS/BLUE SHIELD | Admitting: Orthopaedic Surgery

## 2018-03-21 ENCOUNTER — Inpatient Hospital Stay: Payer: BLUE CROSS/BLUE SHIELD

## 2018-03-23 ENCOUNTER — Inpatient Hospital Stay: Payer: BLUE CROSS/BLUE SHIELD | Attending: Orthopaedic Surgery

## 2018-03-23 DIAGNOSIS — M25571 Pain in right ankle and joints of right foot: Secondary | ICD-10-CM | POA: Insufficient documentation

## 2018-03-23 NOTE — PT/OT Therapy Note (Signed)
Name: Anna Lamb Age: 47 y.o.   Referring Physician: Candyce Churn, MD   Date of Injury: 02/03/2018  Date Care Plan Established/Reviewed: 02/22/2018  Date Treatment Started: 02/22/2018  Visit Count: 6   Diagnosis:   1. Pain in joint, ankle and foot, right        Subjective     Pain   No pain reported    Pt reports she is still putting weight through the L LE - states "its just too hard" and that she has ditched the crutches. Pt states she is still having the hardest time with falling asleep at night and that she has been needing to take muscle relaxers and gabapentin.     Objective     03/02/2018 Figure 53 cm          Initial Evaluation Reference and/orCurrent Measurements (ROM, Strength, Girth, Outcomes, etc.):                Range of Motion     02/22/2018 L AROM  02/22/2018 L PROM  03/02/2018   03/23/2018   Ankle/Foot 02/22/2018 R AROM  03/14/18 L AROM   neutral 5 AROM:5  PROM:8 AROM: 5  PROM:8 Dorsiflexion 12 0   30 40 --  Plantarflexion 45 45   30, P! 35 --  Ankle Inversion 35 30   10,P! 12 --  Ankle Eversion 16 10   (blank fields were intentionally left blank)  DF measured with knee in extension    Strength     02/22/2018 L  Strength  Hip  MMT 02/22/2018 R   4 Hip Abduction 4   4 Quadriceps 4   4 Hamstrings 4   (blank fields were intentionally left blank)  Deferred formal testing of ankle musculature in order to respect soft tissue healing. Will reassess at a later date     Treatment     Therapeutic Exercises   Justification: To improve:Flexibility/ROM, Stabilization and Strength     Use of recumbent bike for increased blood flow and with subjective taken to determine course of treatment progression (6 min).     See exercise flowsheet.     Manual movement of the R ankle into all planes.     Calf stretch with strap to improve posterior chain mobility, cues for mod stretch no pain    Review and update of HEP    Neuromuscular Re-Education   Justification: To improve facilitation and endurance of involved segments  Weight  shifts in boot with emphasis on maintaining upright trunk and preventing compensatory trunk lean    Gait training in boot with cane (recommended 1 crutch in home environment-patient self [place] crutches)--patient able to achieve reciprocal gait pattern with use of cane     Standing hip extension and abduction with UE support in boot-vc to prevent compensatory trunk lean     Manual Therapy   Justification: To improve joint mobility, soft tissue mobility, and reduce trigger points  Prone STM to calf with prolonged DF stretch    (patient reported less pain with WB in boot following intervention)       Therapeutic Activity   Justification:          ---      ---   Total Time   Timed Minutes  41 minutes   Total Time  41 minutes        Assessment   No increase in pain pre/post. Patient cleared to wean from boot. However, she self weaned from  crutches. Demonstrated improved gait mechanics with use of cane in clinic. Recommended use of 1 crutch for now until she could obtain a reciprocal gait pattern without use of crutch or increase in pain.     WB exercises given for HEP, but patient educated to perform in boot for now based on current WB tolerance. HEAVILY emphasized the importance of regular ankle ROM and calf stretching as she has made little gains with DF since last assessment. Discussed role of DF and weight bearing tolerance.      Plan   Cont per POC.   Check response to updated HEP  Continue to progress WB activities as tolerated (instructed to bring her shoe to the clinic from now on so that when appropriate therapist can begin weaning out of boot)      Goals    Goal 1:  Patient to demonstrate independence and report compliance with HEP at least 5x/week       03/23/2018- Updated HEP   Sessions:  18      Goal 2:  Patient to demonstrated at least 10 d of DF in order to walk at least 30 minutes without compensatory strategy (when appropriate via protocol)    Sessions:  18      Goal 3:  Patient to achieve predicted FOTO  score of 53 in order to maximize functional recovery      Sessions:  18      Goal 4:  Patient to demonstrate 5/5 strength in L ankle musculature in order to be able to walk without compensatory strategy    Sessions:  18                                Donald Siva, PT, DPT       Name: [name]  Referring Physician: Candyce Churn, MD  Diagnosis:  1. Pain in joint, ankle and foot, right          Precautions:  NWB, please see protocol in referral Date of Surgery:  s/p LEFT ORIF, ANKLE, TRIMALLEOLAR DOS 02/03/18 MD Follow-up:  03/20/2018          Exercise Flow Sheet    Exercise Specifics 02/27/18 03/02/2018 03/07/18 03/14/18 03/23/2018    PN   SLR    2x15 AT HEP  3 x 15 -JB 3x15  AH -- Standing hip extension and abduction in boot with UE support 2 x 10 reps-JB         Sidelying hip abduction  2x15 AT 3 x 15-JB 3x15   AH  -- Weight shifts sagittal plane 2 x 10 reps-JB       Ankle 4 way     Ankle circles 2 x 20 reps each direction-JB 2x20 reps  AH  Ankle alphabet  x2 ea  AH  Calf stretch with strap 2 x 30 seconds-JB           Seated calf stretch 3x30" AT HEP  3 x 30'' -JB MT  AH  pirformis stretch supine  3x30 sec  AH               SL clams   GTB  2x10x10 sec  AH    GTB  clams with hip abd  2x10   AH               Prone hip ext  2x20   AH  x20x10 sec  AH  x20 at EOB  AH                Stand hip 4 way on R LE  x20 flex, abd, ext                                                           Rec bike  6 min  AH  6 min  AH  6 minute-JB       Home Exercise Program     Reviewed-JB   Updated-JB       (Initials = supervised exercise by clinician)  (White date box = 2 week treatment reminder)    Access Code: [email or web address]   URL: [email or web address]   Date: 03/23/2018   Prepared by: Donald Siva     Program Notes   Perform exercises in boot     Exercises   Standing Hip Abduction - 10 reps - 2 sets - 1x daily - 7x weekly   Seated Calf Stretch with Strap - 2 sets - 30 hold - 1x daily - 7x weekly   Standing Hip  Extension - 10 reps - 2 sets - 1x daily - 7x weekly   Standing Weight Shifting Forward and Backward - 10 reps - 2 sets - 1x daily - 7x weekly

## 2018-03-28 ENCOUNTER — Encounter (INDEPENDENT_AMBULATORY_CARE_PROVIDER_SITE_OTHER): Payer: Self-pay

## 2018-03-28 ENCOUNTER — Inpatient Hospital Stay: Payer: BLUE CROSS/BLUE SHIELD | Attending: Orthopaedic Surgery

## 2018-03-28 ENCOUNTER — Other Ambulatory Visit (INDEPENDENT_AMBULATORY_CARE_PROVIDER_SITE_OTHER): Payer: Self-pay

## 2018-03-28 DIAGNOSIS — M25571 Pain in right ankle and joints of right foot: Secondary | ICD-10-CM | POA: Insufficient documentation

## 2018-03-28 DIAGNOSIS — S82852D Displaced trimalleolar fracture of left lower leg, subsequent encounter for closed fracture with routine healing: Secondary | ICD-10-CM

## 2018-03-28 MED ORDER — CEPHALEXIN 500 MG PO CAPS
500.00 mg | ORAL_CAPSULE | Freq: Four times a day (QID) | ORAL | 0 refills | Status: AC
Start: 2018-03-28 — End: 2018-04-07

## 2018-03-28 NOTE — PT/OT Therapy Note (Signed)
Name: Anna Lamb Age: 47 y.o.   Referring Physician: Candyce Churn, MD   Date of Injury: 02/03/2018  Date Care Plan Established/Reviewed: 02/22/2018  Date Treatment Started: 02/22/2018  Visit Count: 7   Diagnosis:   1. Pain in joint, ankle and foot, right        Subjective     Pain   No pain reported    Pt states she has been feeling better about the WB through the boot and it isnt feeling as terrible. Pt states she hasn't gotten the restless feeling in a couple days - thinks it might be due to all the stretching. Pt states she is only taking the gabapentin when needed.     Objective     03/02/2018 Figure 53 cm          Initial Evaluation Reference and/orCurrent Measurements (ROM, Strength, Girth, Outcomes, etc.):                Range of Motion     02/22/2018 L AROM  02/22/2018 L PROM  03/02/2018   03/23/2018   Ankle/Foot 02/22/2018 R AROM  03/14/18 L AROM   neutral 5 AROM:5  PROM:8 AROM: 5  PROM:8 Dorsiflexion 12 0   30 40 --  Plantarflexion 45 45   30, P! 35 --  Ankle Inversion 35 30   10,P! 12 --  Ankle Eversion 16 10   (blank fields were intentionally left blank)  DF measured with knee in extension    Strength     02/22/2018 L  03/28/18  Strength  Hip  MMT 02/22/2018 R   4 4+ Hip Abduction 4   4 5- Quadriceps 4   4 5- Hamstrings 4   (blank fields were intentionally left blank)  Deferred formal testing of ankle musculature in order to respect soft tissue healing. Will reassess at a later date     Treatment     Therapeutic Exercises   Justification: To improve:Flexibility/ROM, Stabilization and Strength   Use of recumbent bike for increased blood flow and with subjective taken to determine course of treatment progression (6 min).   See exercise flowsheet.   Progression to standing gastroc stretching.   Education with patient regarding potential infection and taking picture to monitor wound on ankle.     Neuromuscular Re-Education   Justification: To improve facilitation and endurance of involved segments  Tandem balance with  each foot forward. Verbal cues to reduce UE support and continue working on increased equal WB through the L LE.     Manual Therapy   Justification: To improve joint mobility, soft tissue mobility, and reduce trigger points  Prone: STM to calf with prolonged DF stretch            ---      ---   Total Time   Timed Minutes  45 minutes   Total Time  45 minutes        Assessment   Pt entered clinic with bandage over L ankle scar - says it was bleeding after the shower yesterday - thinks she may have cut it with her razor. Pt cut presents with redness, tenderness to touch, and swelling and red rash. Pt was advised to contact physician for further instruction and to be cautious and prevent infection. Pt was able to improve with WB and progress to tandem balance activities.       Plan   Cont per POC.   F/u on infection of ankle cut and  redness/etc.   Continue to progress WB activities as tolerated (instructed to bring her shoe to the clinic from now on so that when appropriate therapist can begin weaning out of boot)      Goals    Goal 1:  Patient to demonstrate independence and report compliance with HEP at least 5x/week       03/23/2018- Updated HEP   Sessions:  18      Goal 2:  Patient to demonstrated at least 10 d of DF in order to walk at least 30 minutes without compensatory strategy (when appropriate via protocol)    Sessions:  18      Goal 3:  Patient to achieve predicted FOTO score of 53 in order to maximize functional recovery      Sessions:  18      Goal 4:  Patient to demonstrate 5/5 strength in L ankle musculature in order to be able to walk without compensatory strategy    Sessions:  18                                Kattie Santoyo B Corianne Buccellato, PT, DPT       Name: [name]  Referring Physician: Candyce Churn, MD  Diagnosis:  1. Pain in joint, ankle and foot, right          Precautions:  NWB, please see protocol in referral Date of Surgery:  s/p LEFT ORIF, ANKLE, TRIMALLEOLAR DOS 02/03/18 MD Follow-up:   03/20/2018          Exercise Flow Sheet    Exercise Specifics 02/27/18 03/02/2018 03/07/18 03/14/18 03/23/2018 03/28/18   PN   SLR    2x15 AT HEP  3 x 15 -JB 3x15  AH -- Standing hip extension and abduction in boot with UE support 2 x 10 reps-JB Rec bike  6 min  AH         Sidelying hip abduction  2x15 AT 3 x 15-JB 3x15   AH  -- Weight shifts sagittal plane 2 x 10 reps-JB --      Ankle 4 way     Ankle circles 2 x 20 reps each direction-JB 2x20 reps  AH  Ankle alphabet  x2 ea  AH  Calf stretch with strap 2 x 30 seconds-JB Standing calf stretch  x2 min 1/2 foam roll  AH           Seated calf stretch 3x30" AT HEP  3 x 30'' -JB MT  AH  pirformis stretch supine  3x30 sec  AH   Stand knee flex/DF stretch on 8 inch step  x20  AH             SL clams   GTB  2x10x10 sec  AH    GTB  clams with hip abd  2x10   AH   Tandem stance  x1 min ea  AH             Prone hip ext  2x20   AH  x20x10 sec  AH     x20 at EOB  AH   Stand heel raise  PF towel  x20   AH              Stand hip 4 way on R LE  x20 flex, abd, ext  Side step no TB  x2 hall  [facility]  Rec bike  6 min  AH  6 min  AH  6 minute-JB       Home Exercise Program     Reviewed-JB   Updated-JB       (Initials = supervised exercise by clinician)  (White date box = 2 week treatment reminder)    Access Code: [email or web address]   URL: [email or web address]   Date: 03/23/2018   Prepared by: Donald Siva     Program Notes   Perform exercises in boot     Exercises   Standing Hip Abduction - 10 reps - 2 sets - 1x daily - 7x weekly   Seated Calf Stretch with Strap - 2 sets - 30 hold - 1x daily - 7x weekly   Standing Hip Extension - 10 reps - 2 sets - 1x daily - 7x weekly   Standing Weight Shifting Forward and Backward - 10 reps - 2 sets - 1x daily - 7x weekly

## 2018-03-28 NOTE — Progress Notes (Signed)
Spoke with patient about ankle wound.  Area is warm to touch with slight swelling and yellow drainage.  Patient notes no foul odor or purulence.  Will e-prescribe Keflex 4x/day and have patient follow in office later this week.  If significant improvement, patient to cancel appointment.  Patient to call back sooner as needed and is amenable with this plan of care.

## 2018-03-30 ENCOUNTER — Inpatient Hospital Stay: Payer: BLUE CROSS/BLUE SHIELD | Attending: Orthopaedic Surgery

## 2018-03-30 DIAGNOSIS — M25571 Pain in right ankle and joints of right foot: Secondary | ICD-10-CM | POA: Insufficient documentation

## 2018-03-30 NOTE — PT/OT Therapy Note (Signed)
Name: Anna Lamb Age: 47 y.o.   Referring Physician: Candyce Churn, MD   Date of Injury: 02/03/2018  Date Care Plan Established/Reviewed: 02/22/2018  Date Treatment Started: 02/22/2018  Visit Count: 8   Diagnosis:   1. Pain in joint, ankle and foot, right        Subjective     Pain   No pain reported    WB continues to improve. Stretching really helped.  WB in shoe in home, [facility].  Physician gave her antibiotics for wound, healing well.      Objective   8/82019: Wound healing without current signs of infection, no longer red. Educated on how to monitor for signs of infection   03/02/2018 Figure 53 cm          Initial Evaluation Reference and/orCurrent Measurements (ROM, Strength, Girth, Outcomes, etc.):                Range of Motion     02/22/2018 L AROM  02/22/2018 L PROM  03/02/2018   03/23/2018   Ankle/Foot 02/22/2018 R AROM  03/14/18 L AROM   neutral 5 AROM:5  PROM:8 AROM: 5  PROM:8 Dorsiflexion 12 0   30 40 --  Plantarflexion 45 45   30, P! 35 --  Ankle Inversion 35 30   10,P! 12 --  Ankle Eversion 16 10   (blank fields were intentionally left blank)  DF measured with knee in extension    Strength     02/22/2018 L  03/28/18  Strength  Hip  MMT 02/22/2018 R   4 4+ Hip Abduction 4   4 5- Quadriceps 4   4 5- Hamstrings 4   (blank fields were intentionally left blank)  Deferred formal testing of ankle musculature in order to respect soft tissue healing. Will reassess at a later date     Treatment     Therapeutic Exercises   Justification: To improve:Flexibility/ROM, Stabilization and Strength   Use of recumbent bike for increased blood flow and with subjective taken to determine course of treatment progression (6 min).     See exercise flowsheet.     Cont standing gastroc stretching.     Education of soreness rules and how to apply weaning out of boot    Review and update of HEP    Neuromuscular Re-Education   Justification: To improve facilitation and endurance of involved segments  Tandem balance with each  foot forward. Verbal cues to reduce UE support and continue working on increased equal WB through the L LE on foam    Walking practice with emphasis on toe off x 4 laps in clinic     Heel raises with verbal cues for eccentric lower     Step ups with verbal cues to prevent dynamic valgus collapse and for eccentric lower         Manual Therapy   Justification: To improve joint mobility, soft tissue mobility, and reduce trigger points  Prone: STM to calf with prolonged DF stretch            ---      ---   Total Time   Timed Minutes  41 minutes   Total Time  41 minutes        Assessment   Wound healing without ill effect at this time. No pain following treatment. Able to tolerate entire session WB without issue in shoe. Educated on soreness rules and how to properly wean from boot. Updated HEP (  to perform in shoe as long as pain free).            Plan   Cont per POC.   Check response to updated HEP  Get strength measurements  Progress note 2 visits      Goals    Goal 1:  Patient to demonstrate independence and report compliance with HEP at least 5x/week       03/23/2018- Updated HEP   Sessions:  18      Goal 2:  Patient to demonstrated at least 10 d of DF in order to walk at least 30 minutes without compensatory strategy (when appropriate via protocol)    Sessions:  18      Goal 3:  Patient to achieve predicted FOTO score of 53 in order to maximize functional recovery      Sessions:  18      Goal 4:  Patient to demonstrate 5/5 strength in L ankle musculature in order to be able to walk without compensatory strategy    Sessions:  18                                Donald Siva, PT, DPT       Name: Anna Lamb  Referring Physician: Candyce Churn, MD  Diagnosis:  1. Pain in joint, ankle and foot, right          Precautions:  NWB, please see protocol in referral Date of Surgery:  s/p LEFT ORIF, ANKLE, TRIMALLEOLAR DOS 02/03/18 MD Follow-up:  03/20/2018          Exercise Flow Sheet    Exercise Specifics 02/27/18  03/02/2018 03/07/18 03/14/18 03/23/2018 03/28/18 03/30/2018  PN   SLR    2x15 AT HEP  3 x 15 -JB 3x15  AH -- Standing hip extension and abduction in boot with UE support 2 x 10 reps-JB Rec bike  6 min  AH  6'-JB       Sidelying hip abduction  2x15 AT 3 x 15-JB 3x15   AH  -- Weight shifts sagittal plane 2 x 10 reps-JB --      Ankle 4 way     Ankle circles 2 x 20 reps each direction-JB 2x20 reps  AH  Ankle alphabet  x2 ea  AH  Calf stretch with strap 2 x 30 seconds-JB Standing calf stretch  x2 min 1/2 foam roll  AH  Standing calf stretch  x2 min 1/2 foam roll, 2 x 30 seconds-JB         Seated calf stretch 3x30" AT HEP  3 x 30'' -JB MT  AH  pirformis stretch supine  3x30 sec  AH   Stand knee flex/DF stretch on 8 inch step  x20  AH    Step ups 4 inch 2 x 10 reps with single UE support-JB           SL clams   GTB  2x10x10 sec  AH    GTB  clams with hip abd  2x10   AH   Tandem stance  x1 min ea  AH  Tandem on foam 2 x 30 seconds-JB           Prone hip ext  2x20   AH  x20x10 sec  AH     x20 at EOB  AH   Stand heel raise  PF towel  x20   AH  Heel raise 3  x 10 reps-JB            Stand hip 4 way on R LE  x20 flex, abd, ext  Side step no TB  x2 hall  AH                                                          Rec bike  6 min  AH  6 min  AH  6 minute-JB       Home Exercise Program     Reviewed-JB   Updated-JB       (Initials = supervised exercise by clinician)  (White date box = 2 week treatment reminder)        Access Code: [email or web address]   URL: [email or web address]   Date: 03/30/2018   Prepared by: Donald Siva     Program Notes   Perform exercises in shoe    Exercises   Standing Hip Abduction - 10 reps - 2 sets - 1x daily - 7x weekly   Standing Hip Extension - 10 reps - 2 sets - 1x daily - 7x weekly   Standing Heel Raises - 10 reps - 2 sets - 1x daily - 7x weekly   Tandem Stance with Support - 2 sets - 30 hold - 1x daily - 7x weekly   Seated Calf Stretch with Strap - 2 sets - 30 hold - 1x daily - 7x weekly   Standing  Gastroc Stretch - 2 sets - 30 hold - 1x daily - 7x weekly

## 2018-03-31 ENCOUNTER — Ambulatory Visit (INDEPENDENT_AMBULATORY_CARE_PROVIDER_SITE_OTHER): Payer: BLUE CROSS/BLUE SHIELD | Admitting: Physician Assistant

## 2018-04-03 ENCOUNTER — Inpatient Hospital Stay: Payer: BLUE CROSS/BLUE SHIELD | Attending: Orthopaedic Surgery

## 2018-04-03 DIAGNOSIS — M25571 Pain in right ankle and joints of right foot: Secondary | ICD-10-CM | POA: Insufficient documentation

## 2018-04-03 NOTE — PT/OT Therapy Note (Signed)
Name: Anna Lamb Age: 47 y.o.   Referring Physician: Candyce Churn, MD   Date of Injury: 02/03/2018  Date Care Plan Established/Reviewed: 02/22/2018  Date Treatment Started: 02/22/2018  Visit Count: 9   Diagnosis:   1. Pain in joint, ankle and foot, right        Subjective     Pain   No pain reported    No issues with PT or exercises (felt really good after last session)  Continues not to have issues with wound, did not follow up with nurse as it appears to have healed (spoke again with nurse on phone who is aware she will not be following up)  Patient reports she went to the mall on Saturday (pushed wheelchair and walked or rode in it with boot on)  Did not do much of anything yesterday...Marland Kitchenfeels really stiff and swollen today    Note: Arrive to PT in CAM boot, put shoe on for PT (reports her shoe feels tight secondary to swelling/stiffness)    Objective   04/03/2018: Wound continues to heal without current signs of infection, no significant swelling noted along L ankle   8/82019: Wound healing without current signs of infection, no longer red. Educated on how to monitor for signs of infection   03/02/2018 Figure 53 cm          Initial Evaluation Reference and/orCurrent Measurements (ROM, Strength, Girth, Outcomes, etc.):                Range of Motion     02/22/2018 L AROM  02/22/2018 L PROM  03/02/2018   03/23/2018   Ankle/Foot 02/22/2018 R AROM  03/14/18 L AROM   neutral 5 AROM:5  PROM:8 AROM: 5  PROM:8 Dorsiflexion 12 0   30 40 --  Plantarflexion 45 45   30, P! 35 --  Ankle Inversion 35 30   10,P! 12 --  Ankle Eversion 16 10   (blank fields were intentionally left blank)  DF measured with knee in extension    Strength     02/22/2018 L  03/28/18  Strength  Hip  MMT 02/22/2018 R   4 4+ Hip Abduction 4   4 5- Quadriceps 4   4 5- Hamstrings 4   (blank fields were intentionally left blank)  Deferred formal testing of ankle musculature in order to respect soft tissue healing. Will reassess at a later date     Treatment      Therapeutic Exercises   Justification: To improve:Flexibility/ROM, Stabilization and Strength   Use of recumbent bike for increased blood flow and with subjective taken to determine course of treatment progression (6 min).     See exercise flowsheet.     Cont standing gastroc stretching.     Heel raises to improve blood flow and strengthen gastroc    Weight shifts to promote weight acceptance (no pain after manual 2/10 pain prior)      Education of soreness rules and how to apply weaning out of boot (reviewed)        Manual Therapy   Justification: To improve joint mobility, soft tissue mobility, and reduce trigger points  Prone: STM to calf with prolonged DF stretch            ---      ---   Total Time   Timed Minutes  31 minutes   Total Time  31 minutes        Assessment   Patient reports stiffness/discomfort much  better after manual (0/10 pain with walking and WB vs 2/10). Per patient report she did not do much yesterday (after doing a lot on Saturday) which is likely the reason she was so sore/stiff at the beginning of today's treatment. This appears to be a common pattern with the patient throughout initiation of PT despite frequent education from her therapists.    Patient requested to leave early stating ("can I be done") because her daughter was waiting on her in the waiting room. Thus, unable to perform all intended exercises or obtain objective information for progress note next session. Overall, patient did not give best effort during today's session.      Therapist emphasized importance of regular compliance with HEP and soreness rules with weaning out of the boot. Patient acknowledged understanding.                Plan   Cont per POC.   Objective measurements for progress note (unable to obtain this visit as patient abruptly requested to be done 14 minutes prior to the end of the session)  Progress note next visit  Continue progression of WB exercises as appropriate      Goals    Goal 1:  Patient to  demonstrate independence and report compliance with HEP at least 5x/week       03/23/2018- Updated HEP   Sessions:  18      Goal 2:  Patient to demonstrated at least 10 d of DF in order to walk at least 30 minutes without compensatory strategy (when appropriate via protocol)    Sessions:  18      Goal 3:  Patient to achieve predicted FOTO score of 53 in order to maximize functional recovery      Sessions:  18      Goal 4:  Patient to demonstrate 5/5 strength in L ankle musculature in order to be able to walk without compensatory strategy    Sessions:  18                                Donald Siva, PT, DPT       Name: Anna Lamb  Referring Physician: Candyce Churn, MD  Diagnosis:  1. Pain in joint, ankle and foot, right          Precautions:  NWB, please see protocol in referral Date of Surgery:  s/p LEFT ORIF, ANKLE, TRIMALLEOLAR DOS 02/03/18 MD Follow-up:  03/20/2018          Exercise Flow Sheet    Exercise Specifics 02/27/18 03/02/2018 03/07/18 03/14/18 03/23/2018 03/28/18 03/30/2018 04/03/2018 PN   SLR    2x15 AT HEP  3 x 15 -JB 3x15  AH -- Standing hip extension and abduction in boot with UE support 2 x 10 reps-JB Rec bike  6 min  AH  6'-JB 5'-JB      Sidelying hip abduction  2x15 AT 3 x 15-JB 3x15   AH  -- Weight shifts sagittal plane 2 x 10 reps-JB --      Ankle 4 way     Ankle circles 2 x 20 reps each direction-JB 2x20 reps  AH  Ankle alphabet  x2 ea  AH  Calf stretch with strap 2 x 30 seconds-JB Standing calf stretch  x2 min 1/2 foam roll  AH  Standing calf stretch  x2 min 1/2 foam roll, 2 x 30 seconds-JB Standing calf stretch, level 1,  3 x 30 seconds-JB        Seated calf stretch 3x30" AT HEP  3 x 30'' -JB MT  AH  pirformis stretch supine  3x30 sec  AH   Stand knee flex/DF stretch on 8 inch step  x20  AH    Step ups 4 inch 2 x 10 reps with single UE support-JB           SL clams   GTB  2x10x10 sec  AH    GTB  clams with hip abd  2x10   AH   Tandem stance  x1 min ea  AH  Tandem on foam 2 x 30 seconds-JB            Prone hip ext  2x20   AH  x20x10 sec  AH     x20 at EOB  AH   Stand heel raise  PF towel  x20   AH  Heel raise 3 x 10 reps-JB Heel raise 3 x 10 reps-JB           Stand hip 4 way on R LE  x20 flex, abd, ext  Side step no TB  x2 hall  AH   Weight shifts in shoe 2 x 10 reps with UE support-JB                                                       Rec bike  6 min  AH  6 min  AH  6 minute-JB       Home Exercise Program     Reviewed-JB   Updated-JB   -ReviewedJB    (Initials = supervised exercise by clinician)  (White date box = 2 week treatment reminder)        Access Code: [email or web address]   URL: [email or web address]   Date: 03/30/2018   Prepared by: Donald Siva     Program Notes   Perform exercises in shoe    Exercises   Standing Hip Abduction - 10 reps - 2 sets - 1x daily - 7x weekly   Standing Hip Extension - 10 reps - 2 sets - 1x daily - 7x weekly   Standing Heel Raises - 10 reps - 2 sets - 1x daily - 7x weekly   Tandem Stance with Support - 2 sets - 30 hold - 1x daily - 7x weekly   Seated Calf Stretch with Strap - 2 sets - 30 hold - 1x daily - 7x weekly   Standing Gastroc Stretch - 2 sets - 30 hold - 1x daily - 7x weekly

## 2018-04-05 ENCOUNTER — Inpatient Hospital Stay: Payer: BLUE CROSS/BLUE SHIELD | Attending: Orthopaedic Surgery

## 2018-04-05 DIAGNOSIS — M25571 Pain in right ankle and joints of right foot: Secondary | ICD-10-CM | POA: Insufficient documentation

## 2018-04-05 NOTE — PT/OT Therapy Note (Signed)
Name: Anna Lamb Age: 47 y.o.   Referring Physician: Candyce Churn, MD   Date of Injury: 02/03/2018  Date Care Plan Established/Reviewed: 02/22/2018  Date Treatment Started: 02/22/2018  Visit Count: 10   Diagnosis:   1. Pain in joint, ankle and foot, right        Subjective     History of Present Illness   Functional Limitations (PLOF): Current//Eval (PLOF): walking 5-10 minutes without stopping //Unable to walk any distance, currently due to protocol (> 1 hour prior), Standing for 20 minutes at one time// Unable to stand any amount of time, currently due to protocol (> 1 hour prior)    Outcome Measure   Tool Used/Details: FOTO   Score: 55  Predicted Functional Outcome: 53    Pain   No pain reported    Pt reports that she hasn't worn the boot yesterday and not at all today either. States she has not noticed increased swelling or pain with ambulation and it actually feels better since she hasn't been in the boot. Pt did not bring CAM boot to therapy session.     Objective   04/03/2018: Wound continues to heal without current signs of infection, no significant swelling noted along L ankle   8/82019: Wound healing without current signs of infection, no longer red. Educated on how to monitor for signs of infection   03/02/2018 Figure 53 cm     Balance   Left   Eyes Open (Left)(sec)      Trial 1: 2 seconds     Trial 2: 1 seconds     Trial 3: 0 seconds     Average: 1 seconds    Strength     04/05/18   Left Strength  Hip  MMT 04/05/18   Right     Hip Flexion       Hip Extension     4  Hip Abduction 5      Hip Adduction       Hip IR       Hip ER     5  Quadriceps 5    5  Hamstrings 5    (blank fields were intentionally left blank)    04/05/18   Left Strength  Ankle/Foot  MMT 04/05/18   Right   4  Ankle Dorsiflexion 5    4+  Ankle Plantarflexion 5    3+  Ankle Inversion 5    3+  Ankle Eversion 5    (blank fields were intentionally left blank)         Initial Evaluation Reference and/orCurrent Measurements (ROM, Strength, Girth,  Outcomes, etc.):                Range of Motion     02/22/2018 L AROM  02/22/2018 L PROM  03/02/2018   03/23/2018   Ankle/Foot 02/22/2018 R AROM  03/14/18 L AROM 04/05/18 L AROM    neutral 5 AROM:5  PROM:8 AROM: 5  PROM:8 Dorsiflexion 12 0 9    30 40 --  Plantarflexion 45 45 49   30, P! 35 --  Ankle Inversion 35 30 16 p!    10,P! 12 --  Ankle Eversion 16 10 10    (blank fields were intentionally left blank)  DF measured with knee in extension    Strength     02/22/2018 L  03/28/18  Strength  Hip  MMT 02/22/2018 R   4 4+ Hip Abduction 4   4 5- Quadriceps 4  4 5- Hamstrings 4   (blank fields were intentionally left blank)  Deferred formal testing of ankle musculature in order to respect soft tissue healing. Will reassess at a later date     Treatment     Therapeutic Exercises   Justification: To improve:Flexibility/ROM, Stabilization and Strength   Use of recumbent bike for increased blood flow and with subjective taken to determine course of treatment progression (6 min).   See exercise flowsheet.   Cont standing gastroc stretching.   Heel raises to improve blood flow and strengthen gastroc with towel roll between ankles.           Neuromuscular Re-Education   Justification: To improve intrinsic foot musculature and balance and proprioception.   Addition of rockerboard balance activity for increased PF/DF ankle stability and inv/eversion stability with flipping the board.   Addition of forward and lateral step up with contralateral march to improve SLS activity.   Addition of SLS with contralateral ball roll to improve SLS activity. Verbal cues to prevent UE support and to slightly bend the L knee to promote ankle, quad, and hip co-contraction.        ---      ---   Total Time   Timed Minutes  43 minutes   Total Time  43 minutes        Assessment   Pt is 47 y/o female who has been seen for 10 visits of physical therapy at this time. Pt has improved with ability to WB, strength, AROM, and balance and proprioception. Pt continues  to need work on decreasing antalgic gait pattern, improving PF strength in standing, and SLS of the L LE. Pt will continue to benefit from 2x/week for 8 sessions over the next month to continue with current POC.     Pt again asks after 30 minutes of therapy to "be done", pt was encouraged to continue to work on strength and balance to finish the rest of the session. Pt demonstrated improved DF after pt stated use of gastroc stretching at home since last session. Pt will continue to benefit from additional work on PF strength and SLS balancing activities.   Prior Level of Function: Current//Eval (PLOF): walking 5-10 minutes without stopping //Unable to walk any distance, currently due to protocol (> 1 hour prior), Standing for 20 minutes at one time// Unable to stand any amount of time, currently due to protocol (> 1 hour prior)  Prognosis: good  Plan   Visits per week: 2  Number of Sessions: 8  Direct One [employer] One  40347: Therapeutic Exercise: To Develop Strength and Endurance, ROM and Flexibility  [id number]: Gait Training  42595: Neuromuscular Reeducation  [phone]: Self Care/Home Mgmt Training (ADLs, safety procedures, use of assistive devices)  97140: Manual Therapy techniques (mobilization, manipulation, manual traction)  97530: Therapeutic Activities: Dynamic activities to improve functional performance  Dry Needling  Supervised Modalities  97010: Thermal modalities: hot/cold packs  64332: Electrical stimulation  Cont per POC.   Continue to work on [employer] and continue to work on [employer] and proprioception.         Goals    Goal 1:  Patient to demonstrate independence and report compliance with HEP at least 5x/week       04/05/18 - states compliance AH    Sessions:  18      Goal 2:  Patient to demonstrated at least 10 d of DF in order to walk at least 30 minutes without compensatory strategy (when appropriate  via protocol)     04/05/18 - amb 20 minutes without stopping    Sessions:  18      Goal 3:  Patient to achieve predicted  FOTO score of 53 in order to maximize functional recovery     04/05/18 - 55 FOTO - goal met.      Sessions:  18      Goal 4:  Patient to demonstrate 5/5 strength in L ankle musculature in order to be able to walk without compensatory strategy     04/05/18 - still working on reduced antalgic gait pattern.    Sessions:  18                                Ailey Wessling B Kenston Longton, PT, DPT       Name: [name]  Referring Physician: Candyce Churn, MD  Diagnosis:  1. Pain in joint, ankle and foot, right          Precautions:  NWB, please see protocol in referral Date of Surgery:  s/p LEFT ORIF, ANKLE, TRIMALLEOLAR DOS 02/03/18 MD Follow-up:  03/20/2018          Exercise Flow Sheet    Exercise Specifics 02/27/18 03/02/2018 03/07/18 03/14/18 03/23/2018 03/28/18 03/30/2018 04/03/2018 PN  04/05/18   SLR    2x15 AT HEP  3 x 15 -JB 3x15  AH -- Standing hip extension and abduction in boot with UE support 2 x 10 reps-JB Rec bike  6 min  AH  6'-JB 5'-JB 6 min  AH      Sidelying hip abduction  2x15 AT 3 x 15-JB 3x15   AH  -- Weight shifts sagittal plane 2 x 10 reps-JB --   Tandem walk on 1/2 foam roll   x5 laps  AH    Ankle 4 way     Ankle circles 2 x 20 reps each direction-JB 2x20 reps  AH  Ankle alphabet  x2 ea  AH  Calf stretch with strap 2 x 30 seconds-JB Standing calf stretch  x2 min 1/2 foam roll  AH  Standing calf stretch  x2 min 1/2 foam roll, 2 x 30 seconds-JB Standing calf stretch, level 1, 3 x 30 seconds-JB 3x30 sec  AH        Seated calf stretch 3x30" AT HEP  3 x 30'' -JB MT  AH  pirformis stretch supine  3x30 sec  AH   Stand knee flex/DF stretch on 8 inch step  x20  AH    Step ups 4 inch 2 x 10 reps with single UE support-JB           SL clams   GTB  2x10x10 sec  AH    GTB  clams with hip abd  2x10   AH   Tandem stance  x1 min ea  AH  Tandem on foam 2 x 30 seconds-JB  SLS with contraball roll  2x30 sec  AH          Prone hip ext  2x20   AH  x20x10 sec  AH     x20 at EOB  AH   Stand heel raise  PF towel  x20   AH  Heel raise 3 x 10  reps-JB Heel raise 3 x 10 reps-JB Heel raise with L weight shift  x30   AH           Stand hip 4 way  on R LE   x20 flex, abd, ext  Side step no TB  x2 hall  AH   Weight shifts in shoe 2 x 10 reps with UE support-JB                Side stepping  GTB  x2 hall  AH                                        Rec bike  6 min  AH  6 min  AH  6 minute-JB       Home Exercise Program     Reviewed-JB   Updated-JB   -ReviewedJB    (Initials = supervised exercise by clinician)  (White date box = 2 week treatment reminder)        Access Code: [email or web address]   URL: [email or web address]   Date: 03/30/2018   Prepared by: Donald Siva     Program Notes   Perform exercises in shoe    Exercises   Standing Hip Abduction - 10 reps - 2 sets - 1x daily - 7x weekly   Standing Hip Extension - 10 reps - 2 sets - 1x daily - 7x weekly   Standing Heel Raises - 10 reps - 2 sets - 1x daily - 7x weekly   Tandem Stance with Support - 2 sets - 30 hold - 1x daily - 7x weekly   Seated Calf Stretch with Strap - 2 sets - 30 hold - 1x daily - 7x weekly   Standing Gastroc Stretch - 2 sets - 30 hold - 1x daily - 7x weekly

## 2018-04-05 NOTE — Progress Notes (Signed)
Name: Anna Lamb Age: 47 y.o.   Referring Physician: Candyce Churn, MD   Date of Injury: 02/03/2018  Date Care Plan Established/Reviewed: 02/22/2018  Date Treatment Started: 02/22/2018  Visit Count: 10   Diagnosis:   1. Pain in joint, ankle and foot, right        Subjective     History of Present Illness   Functional Limitations (PLOF): Current//Eval (PLOF): walking 5-10 minutes without stopping //Unable to walk any distance, currently due to protocol (> 1 hour prior), Standing for 20 minutes at one time// Unable to stand any amount of time, currently due to protocol (> 1 hour prior)    Outcome Measure   Tool Used/Details: FOTO   Score: 55  Predicted Functional Outcome: 53    Pain   No pain reported    Pt reports that she hasn't worn the boot yesterday and not at all today either. States she has not noticed increased swelling or pain with ambulation and it actually feels better since she hasn't been in the boot. Pt did not bring CAM boot to therapy session.     Objective   04/03/2018: Wound continues to heal without current signs of infection, no significant swelling noted along L ankle   8/82019: Wound healing without current signs of infection, no longer red. Educated on how to monitor for signs of infection   03/02/2018 Figure 53 cm     Balance   Left   Eyes Open (Left)(sec)      Trial 1: 2 seconds     Trial 2: 1 seconds     Trial 3: 0 seconds     Average: 1 seconds    Strength     04/05/18   Left Strength  Hip  MMT 04/05/18   Right     Hip Flexion       Hip Extension     4  Hip Abduction 5      Hip Adduction       Hip IR       Hip ER     5  Quadriceps 5    5  Hamstrings 5    (blank fields were intentionally left blank)    04/05/18   Left Strength  Ankle/Foot  MMT 04/05/18   Right   4  Ankle Dorsiflexion 5    4+  Ankle Plantarflexion 5    3+  Ankle Inversion 5    3+  Ankle Eversion 5    (blank fields were intentionally left blank)         Initial Evaluation Reference and/orCurrent Measurements (ROM, Strength, Girth,  Outcomes, etc.):                Range of Motion     02/22/2018 L AROM  02/22/2018 L PROM  03/02/2018   03/23/2018   Ankle/Foot 02/22/2018 R AROM  03/14/18 L AROM 04/05/18 L AROM    neutral 5 AROM:5  PROM:8 AROM: 5  PROM:8 Dorsiflexion 12 0 9    30 40 --  Plantarflexion 45 45 49   30, P! 35 --  Ankle Inversion 35 30 16 p!    10,P! 12 --  Ankle Eversion 16 10 10    (blank fields were intentionally left blank)  DF measured with knee in extension    Strength     02/22/2018 L  03/28/18  Strength  Hip  MMT 02/22/2018 R   4 4+ Hip Abduction 4   4 5- Quadriceps 4  4 5- Hamstrings 4   (blank fields were intentionally left blank)  Deferred formal testing of ankle musculature in order to respect soft tissue healing. Will reassess at a later date       Assessment   Pt is 47 y/o female who has been seen for 10 visits of physical therapy at this time. Pt has improved with ability to WB, strength, AROM, and balance and proprioception. Pt continues to need work on decreasing antalgic gait pattern, improving PF strength in standing, and SLS of the L LE. Pt will continue to benefit from 2x/week for 8 sessions over the next month to continue with current POC.       Prior Level of Function: Current//Eval (PLOF): walking 5-10 minutes without stopping //Unable to walk any distance, currently due to protocol (> 1 hour prior), Standing for 20 minutes at one time// Unable to stand any amount of time, currently due to protocol (> 1 hour prior)  Prognosis: good  Plan   Visits per week: 2  Number of Sessions: 8  [employer] One  60454: Therapeutic Exercise: To Develop Strength and Endurance, ROM and Flexibility  [id number]: Gait Training  09811: Neuromuscular Reeducation  [phone]: Self Care/Home Mgmt Training (ADLs, safety procedures, use of assistive devices)  97140: Manual Therapy techniques (mobilization, manipulation, manual traction)  97530: Therapeutic Activities: Dynamic activities to improve functional performance  Dry Needling  Supervised  Modalities  97010: Thermal modalities: hot/cold packs  29562: Electrical stimulation  Cont per POC.   Continue to work on [employer] and continue to work on [employer] and proprioception.         Goals    Goal 1:  Patient to demonstrate independence and report compliance with HEP at least 5x/week       04/05/18 - states compliance AH    Sessions:  18      Goal 2:  Patient to demonstrated at least 10 d of DF in order to walk at least 30 minutes without compensatory strategy (when appropriate via protocol)     04/05/18 - amb 20 minutes without stopping    Sessions:  18      Goal 3:  Patient to achieve predicted FOTO score of 53 in order to maximize functional recovery     04/05/18 - 55 FOTO - goal met.      Sessions:  18      Goal 4:  Patient to demonstrate 5/5 strength in L ankle musculature in order to be able to walk without compensatory strategy     04/05/18 - still working on reduced antalgic gait pattern.    Sessions:  18                                Guneet Delpino B Davier Tramell, PT, DPT

## 2018-04-11 ENCOUNTER — Inpatient Hospital Stay: Payer: BLUE CROSS/BLUE SHIELD | Attending: Orthopaedic Surgery

## 2018-04-11 DIAGNOSIS — M25571 Pain in right ankle and joints of right foot: Secondary | ICD-10-CM | POA: Insufficient documentation

## 2018-04-11 NOTE — PT/OT Therapy Note (Signed)
Name: Anna Lamb Age: 47 y.o.   Referring Physician: Candyce Churn, MD   Date of Injury: 02/03/2018  Date Care Plan Established/Reviewed: 04/05/2018  Date Treatment Started: 02/22/2018  Visit Count: 11   Diagnosis:   1. Pain in joint, ankle and foot, right        Subjective     Outcome Measure   Tool Used/Details: FOTO   Score: 55  Predicted Functional Outcome: 53    Pain   No pain reported    Has not been wearing boot, doing well.   Stair management is getting better, but downward phase still difficult   Doesn't do HEP as much as she should.  Wants to decrease frequency of PT because of money    Objective   04/03/2018: Wound continues to heal without current signs of infection, no significant swelling noted along L ankle   8/82019: Wound healing without current signs of infection, no longer red. Educated on how to monitor for signs of infection   03/02/2018 Figure 53 cm     Balance   Left   Eyes Open (Left)(sec)      Trial 1: 2 seconds     Trial 2: 1 seconds     Trial 3: 0 seconds     Average: 1 seconds    Strength     04/05/18   Left Strength  Hip  MMT 04/05/18   Right     Hip Flexion       Hip Extension     4  Hip Abduction 5      Hip Adduction       Hip IR       Hip ER     5  Quadriceps 5    5  Hamstrings 5    (blank fields were intentionally left blank)    04/05/18   Left Strength  Ankle/Foot  MMT 04/05/18   Right   4  Ankle Dorsiflexion 5    4+  Ankle Plantarflexion 5    3+  Ankle Inversion 5    3+  Ankle Eversion 5    (blank fields were intentionally left blank)         Initial Evaluation Reference and/orCurrent Measurements (ROM, Strength, Girth, Outcomes, etc.):                Range of Motion     02/22/2018 L AROM  02/22/2018 L PROM  03/02/2018   03/23/2018   Ankle/Foot 02/22/2018 R AROM  03/14/18 L AROM 04/05/18 L AROM    neutral 5 AROM:5  PROM:8 AROM: 5  PROM:8 Dorsiflexion 12 0 9    30 40 --  Plantarflexion 45 45 49   30, P! 35 --  Ankle Inversion 35 30 16 p!    10,P! 12 --  Ankle Eversion 16 10 10    (blank fields  were intentionally left blank)  DF measured with knee in extension    Strength     02/22/2018 L  03/28/18  Strength  Hip  MMT 02/22/2018 R   4 4+ Hip Abduction 4   4 5- Quadriceps 4   4 5- Hamstrings 4   (blank fields were intentionally left blank)  Deferred formal testing of ankle musculature in order to respect soft tissue healing. Will reassess at a later date     Treatment     Therapeutic Exercises   Justification: To improve:Flexibility/ROM, Stabilization and Strength   Use of recumbent bike for increased blood flow  and with subjective taken to determine course of treatment progression (6 min).     See exercise flowsheet.   Cont standing gastroc stretching.     Review of soreness rules for walking as patient is concerned about amount of walking she will have to do whenever she returns to work     Review and update of HEP            Neuromuscular Re-Education   Justification: To improve intrinsic foot musculature and balance and proprioception.   SL stance on foam to improve balance/proprioception    Heel raises with cues for eccentric lower     Stair management x 2 half floors with emphasis on eccentric lower          Manual Therapy   Justification: To improve joint mobility and reduce soft tissue restrictions that impede movement and inhibit active motion   STM to calf with prolonged stretch into DF       ---      ---   Total Time   Timed Minutes  43 minutes   Total Time  43 minutes        Assessment   No pain pre/post. Patient continues to not be able to perform eccentric calf strengthening which is contributing to impairments in gait and stair management. Patient admits she is not fully compliant with HEP and that she does not want to come to PT as recommended. Updated HEP with education on how to progress each exercise. Importance of compliance with HEP was emphasized.   Plan   Cont per POC.   Continue to work on [employer] and continue to work on [employer] and proprioception.         Goals    Goal 1:  Patient to demonstrate  independence and report compliance with HEP at least 5x/week       04/05/18 - states compliance AH    Sessions:  18      Goal 2:  Patient to demonstrated at least 10 d of DF in order to walk at least 30 minutes without compensatory strategy (when appropriate via protocol)     04/05/18 - amb 20 minutes without stopping    Sessions:  18      Goal 3:  Patient to achieve predicted FOTO score of 53 in order to maximize functional recovery     04/05/18 - 55 FOTO - goal met.      Sessions:  18      Goal 4:  Patient to demonstrate 5/5 strength in L ankle musculature in order to be able to walk without compensatory strategy     04/05/18 - still working on reduced antalgic gait pattern.    Sessions:  [address]                                Donald Siva, PT, DPT       Name: Anna Lamb  Referring Physician: Candyce Churn, MD  Diagnosis:  1. Pain in joint, ankle and foot, right          Precautions:  NWB, please see protocol in referral Date of Surgery:  s/p LEFT ORIF, ANKLE, TRIMALLEOLAR DOS 02/03/18 MD Follow-up:  03/20/2018        Exercise Flow Sheet    Exercise Specifics  04/11/2018         PN     Bike   6' recumbent -JB  Heel raises     3 x 20 reps-JB           SL stance on foam     3 x 30 seconds-JB             Standing calf stretch   3 x 30 seconds on half foam -JB                                                                                                                    Home Exercise Program    Updated HEP           (Initials = supervised exercise by clinician)  (White date box = 2 week treatment reminder)            Exercise Flow Sheet    Exercise Specifics 02/27/18 03/02/2018 03/07/18 03/14/18 03/23/2018 03/28/18 03/30/2018 04/03/2018 PN  04/05/18   SLR    2x15 AT HEP  3 x 15 -JB 3x15  AH -- Standing hip extension and abduction in boot with UE support 2 x 10 reps-JB Rec bike  6 min  AH  6'-JB 5'-JB 6 min  AH      Sidelying hip abduction  2x15 AT 3 x 15-JB 3x15   AH  -- Weight shifts sagittal plane 2 x 10 reps-JB --    Tandem walk on 1/2 foam roll   x5 laps  AH    Ankle 4 way     Ankle circles 2 x 20 reps each direction-JB 2x20 reps  AH  Ankle alphabet  x2 ea  AH  Calf stretch with strap 2 x 30 seconds-JB Standing calf stretch  x2 min 1/2 foam roll  AH  Standing calf stretch  x2 min 1/2 foam roll, 2 x 30 seconds-JB Standing calf stretch, level 1, 3 x 30 seconds-JB 3x30 sec  AH        Seated calf stretch 3x30" AT HEP  3 x 30'' -JB MT  AH  pirformis stretch supine  3x30 sec  AH   Stand knee flex/DF stretch on 8 inch step  x20  AH    Step ups 4 inch 2 x 10 reps with single UE support-JB           SL clams   GTB  2x10x10 sec  AH    GTB  clams with hip abd  2x10   AH   Tandem stance  x1 min ea  AH  Tandem on foam 2 x 30 seconds-JB  SLS with contraball roll  2x30 sec  AH          Prone hip ext  2x20   AH  x20x10 sec  AH     x20 at EOB  AH   Stand heel raise  PF towel  x20   AH  Heel raise 3 x 10 reps-JB Heel raise 3 x 10 reps-JB Heel raise with L weight shift  x30   AH  Stand hip 4 way on R LE   x20 flex, abd, ext  Side step no TB  x2 hall  AH   Weight shifts in shoe 2 x 10 reps with UE support-JB                Side stepping  GTB  x2 hall  AH                                        Rec bike  6 min  AH  6 min  AH  6 minute-JB       Home Exercise Program     Reviewed-JB   Updated-JB   -ReviewedJB    (Initials = supervised exercise by clinician)  (White date box = 2 week treatment reminder)      Access Code: [email or web address]   URL: [email or web address]   Date: 04/11/2018   Prepared by: Donald Siva     Program Notes   Perform exercises in shoe as tolerated     Exercises   Standing Hip Abduction - 15 reps - 2 sets - 1x daily - 7x weekly   Standing Hip Extension - 15 reps - 2 sets - 1x daily - 7x weekly   Standing Heel Raises - 20 reps - 2 sets - 1x daily - 7x weekly   Single Leg Stance with Support - 2 sets - 30 hold - 1x daily - 7x weekly   Seated Calf Stretch with Strap - 2 sets - 30 hold - 1x daily - 7x weekly    Standing Gastroc Stretch - 2 sets - 30 hold - 1x daily - 7x weekly

## 2018-04-13 ENCOUNTER — Inpatient Hospital Stay: Payer: BLUE CROSS/BLUE SHIELD

## 2018-04-18 ENCOUNTER — Inpatient Hospital Stay: Payer: BLUE CROSS/BLUE SHIELD

## 2018-04-20 ENCOUNTER — Inpatient Hospital Stay: Payer: BLUE CROSS/BLUE SHIELD

## 2018-04-20 ENCOUNTER — Telehealth: Payer: Self-pay

## 2018-04-20 NOTE — Telephone Encounter (Signed)
Spoke to patient she forgot about appointment as she was out of town. Spoke about $35 dollar no show fee. Waived as it is first time offense

## 2018-05-01 ENCOUNTER — Encounter (INDEPENDENT_AMBULATORY_CARE_PROVIDER_SITE_OTHER): Payer: Self-pay | Admitting: Orthopaedic Surgery

## 2018-05-01 ENCOUNTER — Ambulatory Visit (INDEPENDENT_AMBULATORY_CARE_PROVIDER_SITE_OTHER): Payer: BLUE CROSS/BLUE SHIELD

## 2018-05-01 ENCOUNTER — Ambulatory Visit (INDEPENDENT_AMBULATORY_CARE_PROVIDER_SITE_OTHER): Payer: BLUE CROSS/BLUE SHIELD | Admitting: Orthopaedic Surgery

## 2018-05-01 VITALS — BP 106/65 | HR 104 | Ht 66.0 in | Wt 190.0 lb

## 2018-05-01 DIAGNOSIS — S82852A Displaced trimalleolar fracture of left lower leg, initial encounter for closed fracture: Secondary | ICD-10-CM

## 2018-05-01 NOTE — Progress Notes (Signed)
Orthpaedic Trauma Follow Up Visit    Chief Complaint   Patient presents with   . Ankle Injury     12 weeks s/p LEFT ORIF, ANKLE, TRIMALLEOLAR DOS 02/03/18       HPI: 47 year old female who is now 56-month status post open reduction internal fixation of a left trimalleolar ankle fracture.  Patient recently finished a course of antibiotics for her superficial wound infection.  Since that time, the redness has improved significantly.  She has no drainage now.  She has been ambulating without significant difficulty.  She gets occasional discomfort with prolonged walking.  She is not using any assistive devices.    PE:   Vitals:    05/01/18 1252   BP: 106/65   Pulse: (!) 104   SpO2: 95%     Left lower extremity exam is notable for well-healed surgical incision.  There is no surrounding erythema or drainage.  She is nontender palpation of the lateral malleolus or medial malleolus.  Range of motion at the ankle is pretty good near normal.  Her sensation is intact light touch in all nerve distribution.  Toes are warm well perfused.    Radiology: Xrays taken in clinic today: AP, lateral, and mortise views of the left ankle obtained and reviewed and demonstrate left trimalleolar ankle fracture status post a reduction internal fixation of the lateral malleolus and medial malleolus.  Medial malleolus is fixed with screws.  Lateral side has a mini plate and plate on the lateral distal fibula.  Fracture line can still be visualized in the lateral malleolus.  However, there is interval healing from previous radiographs.  Overall impression is healed trimalleolar ankle fracture.    Impression: 47 year old female who is now 69-month status post open reduction internal fixation of a left trimalleolar ankle fracture.    Plan: From our standpoint, she has no restrictions.  She can weight-bear as tolerated on the left lower extremity.  She can slowly advance her activity level over the next several weeks.  There are no signs of wound  infection at this point.  She can follow-up with [place] on an as-needed basis if further issues should arise.  We did discuss with her that if any wound issues should arise she should give [place] a call and notify us.    Attending Addendum/Attestation:    I have personally participated in this patient's care. I agree with the clinical information and radiographic information as documented by the Resident or mid-level provider. In addition, I have edited this note to reflect my findings and plan as well as to incorporate any new data.      Emiliano Dyer, MD  Orthopaedic Trauma  Pager [phone]  Office [phone]'  This note was generated within the [employer] EMR using Dragon medical speech recognition software and may contain inherent errors or omissions not intended by the user. Grammatical and punctuation errors, random word insertions, deletions, pronoun errors and incomplete sentences are occasional consequences of this technology due to software limitations. Not all errors are caught or corrected.  Although every attempt is made to root out erroneus and incomplete transcription, the note may still not fully represent the intent or opinion of the author. If there are questions or concerns about the content of this note or information contained within the body of this dictation they should be addressed directly with the author for clarification.

## 2018-05-02 ENCOUNTER — Inpatient Hospital Stay: Payer: BLUE CROSS/BLUE SHIELD

## 2018-05-31 NOTE — PT/OT Therapy Note (Signed)
Discontinuation of Therapy Services    Anna Lamb did not complete prescribed physical therapy visits.      Status is unknown at this time, physical therapy has been discontinued and patient has been discharged from care.  The last therapy note is below for review.      Dorothea Ogle, PT, DPT  [place] [address]        05/31/2018

## 2019-08-24 DIAGNOSIS — Z9289 Personal history of other medical treatment: Secondary | ICD-10-CM

## 2019-08-24 HISTORY — DX: Personal history of other medical treatment: Z92.89

## 2019-09-04 ENCOUNTER — Ambulatory Visit (FREE_STANDING_LABORATORY_FACILITY): Payer: BLUE CROSS/BLUE SHIELD | Admitting: Nurse Practitioner

## 2019-09-04 ENCOUNTER — Encounter (INDEPENDENT_AMBULATORY_CARE_PROVIDER_SITE_OTHER): Payer: Self-pay | Admitting: Nurse Practitioner

## 2019-09-04 VITALS — BP 149/91 | HR 99 | Temp 97.0°F | Resp 16 | Wt 200.0 lb

## 2019-09-04 DIAGNOSIS — R059 Cough, unspecified: Secondary | ICD-10-CM

## 2019-09-04 DIAGNOSIS — R062 Wheezing: Secondary | ICD-10-CM

## 2019-09-04 DIAGNOSIS — Z20822 Contact with and (suspected) exposure to covid-19: Secondary | ICD-10-CM

## 2019-09-04 DIAGNOSIS — R05 Cough: Secondary | ICD-10-CM

## 2019-09-04 DIAGNOSIS — J0110 Acute frontal sinusitis, unspecified: Secondary | ICD-10-CM

## 2019-09-04 MED ORDER — LORATADINE 10 MG PO TABS
10.00 mg | ORAL_TABLET | Freq: Every day | ORAL | 0 refills | Status: AC
Start: 2019-09-04 — End: ?

## 2019-09-04 MED ORDER — AZITHROMYCIN 250 MG PO TABS
ORAL_TABLET | ORAL | 0 refills | Status: AC
Start: 2019-09-04 — End: 2019-09-09

## 2019-09-04 MED ORDER — ALBUTEROL SULFATE HFA 108 (90 BASE) MCG/ACT IN AERS
2.0000 | INHALATION_SPRAY | RESPIRATORY_TRACT | 0 refills | Status: AC | PRN
Start: 2019-09-04 — End: 2019-11-03

## 2019-09-04 MED ORDER — ALBUTEROL-IPRATROPIUM 2.5-0.5 (3) MG/3ML IN SOLN
3.00 mL | Freq: Once | RESPIRATORY_TRACT | Status: DC
Start: 2019-09-04 — End: 2019-09-04

## 2019-09-04 NOTE — Patient Instructions (Signed)
Follow-up Steps for Patients with Pending COVID-19 Testing - 03/02/2019     Thank you for choosing [facility]. During your visit you received a test for COVID-19 and the test performed takes at least 3-5 days to result. The goal for results is 3-5 days, however with the current increase in testing volumes, tests can take up to 10 days for results to be ready. We appreciate your patience.      What should I do while I am waiting for my test results?   • Take good care of yourself and be mindful of the safety of those around you:   • Rest and stay well hydrated.   • Use acetaminophen (Tylenol) to help manage fever.   • Wash your hands often.   • Stay at home except to get medical care.   • Call ahead before seeing your doctor, any other medical provider, or seeking medical care at any healthcare facility.   • Isolate yourself as much as possible.   • Clean all "high touch" surfaces every day.   • Most people with COVID-19 infection will get better; however, a small number will develop worsening infection which may require more intensive treatment. Please contact your doctor or present to the nearest Emergency Department if you develop the warning signs of more severe infection such as:   o A fever over 102 not controlled by acetaminophen (Tylenol)   o Shortness of breath or chest pain   o Worsening cough   o Increasing weakness   o Inability to tolerate oral fluids   • Some general things to think about:   o If you have a medical emergency and need to call 911, notify the person answering the phone that you have, or are being evaluated for COVID-19. If possible, put on a facemask before the ambulance arrives.   o Persons who are placed under active monitoring or facilitated self-monitoring should follow instructions provided by their local health department or occupational health professionals, as appropriate    When will I know the results of my COVID test?     After a minimum of 3-5 days has passed,  there are multiple ways in which you can be informed about the results of your COVID 19 test results. Please ensure we have your most up to date contact information on file before you leave your appointment.     MyChart – you may be notified of results through your MyChart account. The easiest way to find all test results done at an [facility] facility is through your MyChart account, [facility] online patient portal. This is also an easy way to connect with your [facility] primary care team for continued care, if this applies to you. If you do not already have   MyChart activated, you will be given an activation code at the time of testing. Please note that for children age 12-17 MyChart is not available.     You may receive a call from your Primary Care Physician or the physician who ordered the test with the results. You may also receive a call from .   If none of the options above has helped you obtain your results, or you have additional questions, please contact the COVID 19 Notifications Call Center for further instructions at [phone], open 6 days a week, Monday-Saturday from 8:30am-5:00pm.     Again, the goal for results is 3-5 days, however with the current increase in testing volumes, tests can take up to 10   days for results to be ready. We appreciate your patience.     If you need help activating your MyChart account, please let us know. You can also download the MyChart application to your phone using the QR code below.         When can I stop isolation?    Until you receive your results, you should remain home and avoid contact with others (self-isolate).The decision to stop isolation precautions should be made on a case-by-case basis, in consultation with healthcare providers and state and local health departments.     Here is some general advice:     · If your test result is negative, you should stay home until you do not have a fever (without the use of fever reducing medication) or any respiratory  symptoms for at least 3 days.     · If your test result is positive, you should stay home for at least 10 days from the day your symptoms started. If you are still having symptoms at the end of 10 days, continue in-home isolation until 3 days after your symptoms stop. Talk to your healthcare provider to determine what follow-up is needed. You can also schedule telemedicine visits with your primary care physician to discuss ongoing symptoms or new concerns that may arise.     For further information on what you and others in your household should do please visit the Center for Disease Control (CDC) website @ https://www.cdc.gov/coronavirus/2019-ncov/about/steps-when-sick.html     If you are ill and need to be seen by a healthcare provider, and your primary physician is not available, the [facility] are open for walk in visits.     [facility] - open M - F 8 am to 8 pm at the following locations:     [facility]: [address] [place], [place] [address]     [facility]: [address], [place], [place] [address]     Open Daily 8am – 8pm including weekends at the following location:     [facility]: [address], [place], [place] [address]     In addition, all [facility] can care for patients with all conditions, both COVID and non-COVID. Do not hesitate to seek care should you have a healthcare emergency.           Understanding Acute Rhinosinusitis  Acute rhinosinusitis is when the lining of the inside of the nose and the sinuses becomes irritated and swollen. It is also called sinusitis, or a sinus infection.   Sinuses are air-filled spaces in the skull behind the face. They are kept moist and clean by a lining of mucosa. Things such as pollen, smoke, and chemical fumes can irritate the mucosa. It can then swell up. As a response to irritation, the mucosa makes more mucus and other fluids. Tiny hairlike cilia cover the  mucosa. Cilia help carry mucus toward the opening of the sinus. Too much mucus may cause the cilia to stop working. This blocks the sinus opening. A buildup of fluid in the sinuses then causes pain and pressure. It can also cause bacteria to grow in the sinuses.     What causes acute rhinosinusitis?  A sinus infection is most often caused by a virus. You are more likely to get one after having a cold or the flu. In some cases, a sinus infection can be caused by bacteria.   You are at higher risk for   a sinus infection if you:   · Are older in age  · Have structural problems with your sinuses  · Smoke or are exposed to secondhand smoke  · Are exposed to changes in pressure, such as from flying a lot or deep sea diving  · Have asthma or allergies  · Have a weak immune system  · Have dental disease  Symptoms of acute rhinosinusitis  Symptoms of acute rhinosinusitis often last around 7 to 10 days. If you have a bacterial infection, they may last longer. They may also get better but then worsen. You may have:   · Face pain or pressure under the eyes and around the nose  · Headache  · Fluid draining in the back of the throat (postnasal drip)  · Congestion  · Drainage that is thick and colored (often green), instead of clear  · Cough  · Problems with your sense of smell  · Ear pain or hearing problems  · Fever  · Tooth pain  · Fatigue  Diagnosing acute rhinosinusitis  Your healthcare provider will ask about your symptoms and past health. He or she will look at your ears, nose, throat, and sinuses. Imaging tests, such as X-rays, are often not needed.   It can be hard to figure out if a sinus infection is caused by a virus or bacterium. A bacterial infection tends to last longer. Symptoms may also get better but then worsen. Your healthcare provider may take a sample of mucus from your nose to check for bacteria.   Treating acute rhinosinusitis  Most sinus infections will go away within 10 days. Your body will fight off the  virus. If your symptoms seem to get better but then worsen, you may have a bacterial infection instead. Your healthcare provider will then give you antibiotics. Take this medicine until it is gone, even if you feel better.   To help ease your symptoms, your healthcare provider may advise:   · Over-the-counter pain relievers. Medicines such as acetaminophen or ibuprofen can ease sinus pain. They may also lower a fever.  · Nasal washes. Washing your nasal passages with salt water may ease pain and pressure. It can rinse out mucous and other irritants from your sinuses. Your healthcare provider can show you how to do it.  · Nasal steroid spray. This prescription medicine can reduce inflammation in your sinuses.  · Other medicines. Decongestants, antihistamines, and other nasal sprays may give short-term relief. They may help with congestion. Talk with your healthcare provider before taking these medicines.    Preventing acute rhinosinusitis  You can help prevent a sinus infection with these steps:   · Wash your hands well and often.  · Stay away from people who have a cold or upper respiratory infection.  · Don't smoke. And stay away from secondhand smoke.  · Use a humidifier at home.  · Make sure you are up-to-date on your vaccines, such as the flu shot.    When to call your healthcare provider  Call your healthcare provider right away if you have any of these:   · Fever of 100.4°F (38°C) or higher, or as directed by your healthcare provider  · Pain that gets worse  · Symptoms that don’t get better, or get worse  · New symptoms  [employer] last reviewed this educational content on 01/21/2018  © 2000-2020 [employer] [address], [place] [address]. All rights reserved. This information is not intended as a substitute for professional medical

## 2019-09-04 NOTE — Progress Notes (Signed)
[facility]  PROGRESS NOTE     Patient: Anna Lamb   Date: 09/04/2019   MRN: [id number]       Anna Lamb is a 49 y.o. female  4 days of symptoms initially,  headache fatigue now worsening nasal congestion , chest congestion, cough.      HISTORY     History obtained from: Patient    Chief Complaint   Patient presents with   . Cough     Cough, chest congestion, sinus pressure since Friday. Went to [facility] this morning, sent to urgent care to be tested for Covid.         HPI     Review of Systems   Constitutional: Positive for fatigue. Negative for appetite change, chills, diaphoresis and fever.   HENT: Positive for congestion, postnasal drip, rhinorrhea and sinus pain. Negative for ear pain, hearing loss, sinus pressure, sore throat and trouble swallowing.    Eyes: Negative for discharge.   Respiratory: Positive for cough. Negative for chest tightness, shortness of breath and wheezing.    Cardiovascular: Negative for chest pain.   Gastrointestinal: Negative for abdominal pain, diarrhea, nausea and vomiting.   Musculoskeletal: Negative for arthralgias, myalgias, neck pain and neck stiffness.   Skin: Negative for rash.   Allergic/Immunologic: Negative for environmental allergies.   Neurological: Positive for headaches. Negative for dizziness.   Hematological: Negative for adenopathy.   Psychiatric/Behavioral: Negative for confusion and sleep disturbance.   All other systems reviewed and are negative.      History:    Pertinent Past Medical, Surgical, Family and Social History were reviewed.        Current Outpatient Medications:   .  levothyroxine (SYNTHROID, LEVOTHROID) 137 MCG tablet, Take 137 mcg by mouth every morning before breakfast, Disp: , Rfl:   .  PARoxetine (PAXIL) 20 MG tablet, Take 20 mg by mouth every morning, Disp: , Rfl:   .  albuterol sulfate HFA (Proventil HFA) 108 (90 Base) MCG/ACT inhaler, Inhale 2 puffs into the lungs every 4 (four) hours as needed for Wheezing or Shortness of  Breath, Disp: 1 Inhaler, Rfl: 0  .  azithromycin (ZITHROMAX) 250 MG tablet, Take 2 tabs daily on day 1, then 1 tablet daily for 4 days., Disp: 6 tablet, Rfl: 0  .  DULoxetine (CYMBALTA) 30 MG capsule, Take 30 mg by mouth daily, Disp: , Rfl:   .  loratadine (Claritin) 10 MG tablet, Take 1 tablet (10 mg total) by mouth daily, Disp: 30 tablet, Rfl: 0  No current facility-administered medications for this visit.     Allergies   Allergen Reactions   . Penicillin V Hives   . Penicillin V Potassium Hives   . Sulfa Antibiotics Hives   . Sulfacetamide Hives   . Sulfasalazine Hives   . Iodinated Diagnostic Agents Hives   . Penicillins Hives       Medications and Allergies reviewed.    PHYSICAL EXAM     Vitals:    09/04/19 0905   BP: (!) 149/91   BP Site: Left arm   Patient Position: Sitting   Cuff Size: Medium   Pulse: 99   Resp: 16   Temp: 97 F (36.1 C)   TempSrc: Tympanic   SpO2: 97%   Weight: 90.7 kg (200 lb)       Physical Exam   Nursing note and vitals reviewed.  Constitutional: She is oriented to person, place, and time. Vital signs are normal.  She appears well-developed and well-nourished.  Non-toxic appearance. She does not have a sickly appearance. She does not appear ill. No distress.   HENT:   Head: Normocephalic and atraumatic.   Right Ear: External ear and ear canal normal.   Left Ear: External ear and ear canal normal.   Nose: Nose normal. No mucosal edema or rhinorrhea. Right sinus exhibits no frontal sinus tenderness. Left sinus exhibits no frontal sinus tenderness.   Mouth/Throat: Uvula is midline, oropharynx is clear and moist and mucous membranes are normal. No oropharyngeal exudate.   Eyes: Conjunctivae and lids are normal. Right conjunctiva is not injected. Left conjunctiva is not injected. No scleral icterus.   Neck: Trachea normal and normal range of motion. Neck supple.   Cardiovascular: Normal rate, regular rhythm, S1 normal, S2 normal and normal heart sounds.   No murmur heard.  Pulmonary/Chest:  Effort normal and breath sounds normal. No accessory muscle usage. No tachypnea. No respiratory distress. She has no decreased breath sounds. She has no wheezes. She has no rhonchi. She has no rales. She exhibits no tenderness.   Lymphadenopathy:     She has no cervical adenopathy.   Neurological: She is alert and oriented to person, place, and time.   Skin: Skin is warm, dry and intact. No rash noted. She is not diaphoretic. No pallor.   Psychiatric: She has a normal mood and affect.       UCC COURSE     LABS  The following POCT tests were ordered, reviewed and discussed with the patient/family.     Results     ** No results found for the last 24 hours. **              No results found.    No current facility-administered medications for this visit.        PROCEDURES     Procedures    MEDICAL DECISION MAKING     History, physical, labs/studies most consistent with  Sinusitis as the diagnosis.    Chart Review:  Prior PCP, Specialist and/or ED notes reviewed today: Yes  Prior labs/images/studies reviewed today: Yes    Differential Diagnosis: viral illness, covid, bronchitis, sinusitis       ASSESSMENT     Encounter Diagnoses   Name Primary?   . Suspected COVID-19 virus infection    . Acute non-recurrent frontal sinusitis    . Cough Yes   . Wheeze             PLAN      PLAN:  z pak, claritin inhaler f/u as needed, covid 19 pending       Orders Placed This Encounter   Procedures   . COVID-19 SYMPTOMATIC Patient     Requested Prescriptions     Signed Prescriptions Disp Refills   . azithromycin (ZITHROMAX) 250 MG tablet 6 tablet 0     Sig: Take 2 tabs daily on day 1, then 1 tablet daily for 4 days.   Marland Kitchen loratadine (Claritin) 10 MG tablet 30 tablet 0     Sig: Take 1 tablet (10 mg total) by mouth daily   . albuterol sulfate HFA (Proventil HFA) 108 (90 Base) MCG/ACT inhaler 1 Inhaler 0     Sig: Inhale 2 puffs into the lungs every 4 (four) hours as needed for Wheezing or Shortness of Breath       Discussed results and  diagnosis with patient/family.  Reviewed warning signs for worsening condition, as well as, indications for  follow-up with primary care physician and return to urgent care clinic.   Patient/family expressed understanding of instructions.     An After Visit Summary was provided to the patient.

## 2019-09-06 LAB — CORONAVIRUS 2 (SARS-COV-2) RNA DETECTION: SARS CoV 2 Overall Result: NOT DETECTED

## 2020-04-08 ENCOUNTER — Ambulatory Visit (INDEPENDENT_AMBULATORY_CARE_PROVIDER_SITE_OTHER): Payer: BLUE CROSS/BLUE SHIELD | Admitting: Family Nurse Practitioner

## 2020-04-08 ENCOUNTER — Encounter (INDEPENDENT_AMBULATORY_CARE_PROVIDER_SITE_OTHER): Payer: Self-pay | Admitting: Family Nurse Practitioner

## 2020-04-08 VITALS — BP 136/82 | HR 96 | Temp 97.7°F | Resp 16 | Wt 200.0 lb

## 2020-04-08 DIAGNOSIS — R059 Cough, unspecified: Secondary | ICD-10-CM

## 2020-04-08 DIAGNOSIS — R05 Cough: Secondary | ICD-10-CM

## 2020-04-08 DIAGNOSIS — R109 Unspecified abdominal pain: Secondary | ICD-10-CM

## 2020-04-08 DIAGNOSIS — R197 Diarrhea, unspecified: Secondary | ICD-10-CM

## 2020-04-08 DIAGNOSIS — R519 Headache, unspecified: Secondary | ICD-10-CM

## 2020-04-08 LAB — IHS AMB POCT SOFIA (TM) SARS CORONAVIRUS ANTIGEN FIA: Sofia SARS COV2 Antigen POCT: NEGATIVE

## 2020-04-08 NOTE — Progress Notes (Signed)
[facility]  PROGRESS NOTE     Patient: Anna Lamb   Date: 04/08/2020   MRN: [id number]     Anna Lamb is a 49 y.o. female    HISTORY     History obtained from: Patient    Chief Complaint   Patient presents with   . Cough     Diarrhea and headache, body aches, cough since 8/14. Sister positive for covid. Recent tracvels to [place], arrived home 8/14. JJ vaccine 4/21        HPI Anna Lamb is a 48 y.o. female with concern for COVID-19.  She reports diarrhea, headache, cough, and body aches since 8/14.  Recent travel to [place].  Sister tested positive for COVID-19 on 8/15.  She was vaccinated on 4/21 with [employer].      Review of Systems   Constitutional: Negative for chills and fever.   HENT: Negative.  Negative for congestion.    Respiratory: Positive for cough. Negative for shortness of breath.    Gastrointestinal: Positive for abdominal pain and diarrhea. Negative for nausea and vomiting.   Genitourinary: Negative.    Musculoskeletal: Positive for myalgias.   Neurological: Positive for headaches.   All other systems reviewed and are negative.      History:    Pertinent Past Medical, Surgical, Family and Social History were reviewed.        Current Outpatient Medications:   .  losartan (COZAAR) 100 MG tablet, Take 100 mg by mouth daily, Disp: , Rfl:   .  PARoxetine (PAXIL) 20 MG tablet, Take 20 mg by mouth every morning, Disp: , Rfl:   .  DULoxetine (CYMBALTA) 30 MG capsule, Take 30 mg by mouth daily, Disp: , Rfl:   .  levothyroxine (SYNTHROID, LEVOTHROID) 137 MCG tablet, Take 137 mcg by mouth every morning before breakfast, Disp: , Rfl:   .  loratadine (Claritin) 10 MG tablet, Take 1 tablet (10 mg total) by mouth daily, Disp: 30 tablet, Rfl: 0    Allergies   Allergen Reactions   . Penicillin V Hives   . Penicillin V Potassium Hives   . Sulfa Antibiotics Hives   . Sulfacetamide Hives   . Sulfasalazine Hives   . Iodinated Diagnostic Agents Hives   . Penicillins Hives       Medications and Allergies  reviewed.    PHYSICAL EXAM     Vitals:    04/08/20 1117   BP: 136/82   BP Site: Left arm   Patient Position: Sitting   Cuff Size: Large   Pulse: 96   Resp: 16   Temp: 97.7 F (36.5 C)   TempSrc: Tympanic   SpO2: 98%   Weight: 90.7 kg (200 lb)       Physical Exam    PHYSICAL EXAM: Appears well, in no apparent distress. No conjunctival injection. Ears normal. Throat and pharynx normal. Neck supple. No adenopathy in neck. Nose is congested. Sinuses non tender. Chest is clear, without wheezes or rales. Heart is regular rate and rhythm, no murmurs noted. Skin is warm without rashes.     UCC COURSE     LABS  The following POCT tests were ordered, reviewed and discussed with the patient/family.     Results     Procedure Component Value Units Date/Time    Symptomatic Keenan Bachelor COVID [[id number]]  (Normal) Collected: 04/08/20 1138     Updated: 04/08/20 1138     Sofia SARS COV2 Antigen POCT Negative  X-RAY  The following X-ray studies were ordered, visualized and independently interpreted by me. Results were discussed with the patient/family.       No results found.    No current facility-administered medications for this visit.        MEDICAL DECISION MAKING     History, physical and labs/studies most consistent with COVID-19.    Chart Review:  Prior PCP, Specialist and/or ED notes reviewed today: Not Applicable  Prior labs/images/studies reviewed today: Not Applicable    Differential Diagnosis: Sinusitis, Bronchitis, Pharyngitis, Pneumonia, Influenza, COVID-19 and Allergic Rhinitis      ASSESSMENT     Encounter Diagnosis   Name Primary?   . Cough Yes            PLAN      PLAN:   Symptomatic therapy suggested including rest and push fluids.  Advised self-isolation until PCR results available.    Immodium if diarrhea continues after the next 24 hours.    Viral nature of illness discussed including lack of antibiotic effectiveness for diagnosis.   This was discussed in detail with the patient/family.   Call or return to  clinic prn if these symptoms worsen or fail to improve as anticipated.      Orders Placed This Encounter   Procedures   . COVID-19 (SARS-CoV-2)   . Symptomatic Sofia COVID     Requested Prescriptions      No prescriptions requested or ordered in this encounter       Discussed results and diagnosis with patient/family.  Reviewed warning signs for worsening condition, as well as, indications for follow-up with primary care physician and return to urgent care clinic.   Patient/family expressed understanding of instructions.     An After Visit Summary was given to the patient.

## 2020-04-09 LAB — COVID-19 (SARS-COV-2): SARS CoV 2 Overall Result: NOT DETECTED

## 2020-07-23 HISTORY — PX: LAPAROSCOPIC GASTRIC SLEEVE RESECTION: SHX5895

## 2020-12-15 DIAGNOSIS — S92912A Unspecified fracture of left toe(s), initial encounter for closed fracture: Secondary | ICD-10-CM | POA: Diagnosis not present

## 2020-12-15 DIAGNOSIS — S9032XA Contusion of left foot, initial encounter: Secondary | ICD-10-CM | POA: Diagnosis not present

## 2021-03-10 DIAGNOSIS — M25531 Pain in right wrist: Secondary | ICD-10-CM | POA: Diagnosis not present

## 2021-03-10 DIAGNOSIS — M25532 Pain in left wrist: Secondary | ICD-10-CM | POA: Diagnosis not present

## 2021-03-10 DIAGNOSIS — S92505A Nondisplaced unspecified fracture of left lesser toe(s), initial encounter for closed fracture: Secondary | ICD-10-CM | POA: Diagnosis not present

## 2021-03-18 ENCOUNTER — Other Ambulatory Visit: Payer: Self-pay

## 2021-03-18 ENCOUNTER — Encounter: Payer: Self-pay | Admitting: Family

## 2021-03-18 ENCOUNTER — Ambulatory Visit: Payer: Federal, State, Local not specified - PPO | Admitting: Family

## 2021-03-18 VITALS — BP 120/86 | HR 73 | Temp 98.4°F | Resp 16 | Ht 65.0 in | Wt 145.0 lb

## 2021-03-18 DIAGNOSIS — I10 Essential (primary) hypertension: Secondary | ICD-10-CM | POA: Diagnosis not present

## 2021-03-18 DIAGNOSIS — E039 Hypothyroidism, unspecified: Secondary | ICD-10-CM

## 2021-03-18 DIAGNOSIS — F17200 Nicotine dependence, unspecified, uncomplicated: Secondary | ICD-10-CM

## 2021-03-18 DIAGNOSIS — F1011 Alcohol abuse, in remission: Secondary | ICD-10-CM

## 2021-03-18 DIAGNOSIS — F411 Generalized anxiety disorder: Secondary | ICD-10-CM

## 2021-03-18 DIAGNOSIS — Z124 Encounter for screening for malignant neoplasm of cervix: Secondary | ICD-10-CM

## 2021-03-18 DIAGNOSIS — Z1211 Encounter for screening for malignant neoplasm of colon: Secondary | ICD-10-CM

## 2021-03-18 DIAGNOSIS — D1801 Hemangioma of skin and subcutaneous tissue: Secondary | ICD-10-CM

## 2021-03-18 DIAGNOSIS — E538 Deficiency of other specified B group vitamins: Secondary | ICD-10-CM

## 2021-03-18 DIAGNOSIS — J302 Other seasonal allergic rhinitis: Secondary | ICD-10-CM

## 2021-03-18 DIAGNOSIS — Z1231 Encounter for screening mammogram for malignant neoplasm of breast: Secondary | ICD-10-CM

## 2021-03-18 MED ORDER — PAROXETINE HCL 20 MG PO TABS: 20.0000 mg | ORAL_TABLET | Freq: Every day | ORAL | 1 refills | Status: DC

## 2021-03-18 NOTE — Progress Notes (Signed)
Provider: Marlowe Sax FNP-C   Jovane Foutz, Nelda Bucks, NP  Patient Care Team: Jacci Ruberg, Nelda Bucks, NP as PCP - General (Family Medicine)  Extended Emergency Contact Information Primary Emergency Contact: Borg,Barbara Address: [address]          [place], [place] [address] [name] of [place] Mobile Phone: [phone] Relation: Mother Preferred language: English Interpreter needed? No  Code Status:  Full Code  Goals of care: Advanced Directive information Advanced Directives 03/18/2021  Does Patient Have a Medical Advance Directive? Yes  Type of Advance Directive Living will  Does patient want to make changes to medical advance directive? No - Patient declined     Chief Complaint  Patient presents with   Establish Care    New Patient.    HPI:  Pt is a 50 y.o. female seen today establish care here at [facility] for medical management of chronic diseases.Has a medical history of Hypertension,Acquired Hypothyroidism,Generalized anxiety,seasonal Allergies ,vitamin B12 deficiency, Hemangioma among others. Has stiffness ,swelling and pain on fingers has appointment for Rheumatologist.   Asthma - states under controlled uses Albuterol every once in a while.No cough,wheezing or shortness of breath   States quit drinking alcohol 6 weeks ago.Has been attending AAA meeting.  She is due for annual mammogram.would like referral to imaging center in [place] or [place] area close to her home.Would also like referral to Gynecology in [facility] area.  Also due for Shingrix vaccine. Has had COVID-19 vaccine x 2 but did not bring her immunization card today.Advised to get her booster vaccine at her pharmacy.   States has been screening for Hep C and HIV in the past due to partner who had hep C.will wait for medical records for review    Past Medical History:  Diagnosis Date   History of colonoscopy 2019   History of mammogram 2021   History of Papanicolaou smear  of cervix 2019   Hypertension    Past Surgical History:  Procedure Laterality Date   ANKLE SURGERY  2019   THYROIDECTOMY  2011    Allergies  Allergen Reactions   Penicillins    Sulfa Antibiotics Hives    Allergies as of 03/18/2021       Reactions   Penicillins    Sulfa Antibiotics Hives        Medication List        Accurate as of March 18, 2021  2:22 PM. If you have any questions, ask your nurse or doctor.          albuterol 108 (90 Base) MCG/ACT inhaler Commonly known as: VENTOLIN HFA Inhale 1-2 puffs into the lungs every 6 (six) hours as needed for wheezing or shortness of breath.   BARIATRIC MULTIVITAMINS/IRON PO Take 1 capsule by mouth daily.   diazepam 2 MG tablet Commonly known as: VALIUM Take 2 mg by mouth as needed for anxiety.   levothyroxine 137 MCG tablet Commonly known as: SYNTHROID Take 137 mcg by mouth daily before breakfast.   losartan 100 MG tablet Commonly known as: COZAAR Take 100 mg by mouth daily.   PARoxetine 20 MG tablet Commonly known as: PAXIL Take 20 mg by mouth daily.   VITAMIN B-12 PO Take 1 capsule by mouth daily.        Review of Systems  Constitutional:  Negative for appetite change, chills, fatigue, fever and unexpected weight change.  HENT:  Negative for congestion, dental problem, ear discharge, ear pain, facial swelling, hearing loss, nosebleeds,  postnasal drip, rhinorrhea, sinus pressure, sinus pain, sneezing, sore throat, tinnitus and trouble swallowing.   Eyes:  Negative for pain, discharge, redness, itching and visual disturbance.  Respiratory:  Negative for cough, chest tightness, shortness of breath and wheezing.   Cardiovascular:  Negative for chest pain, palpitations and leg swelling.  Gastrointestinal:  Negative for abdominal distention, abdominal pain, blood in stool, constipation, diarrhea, nausea and vomiting.  Endocrine: Negative for cold intolerance, heat intolerance, polydipsia, polyphagia and  polyuria.  Genitourinary:  Negative for difficulty urinating, dysuria, flank pain, frequency and urgency.  Musculoskeletal:  Negative for arthralgias, back pain, gait problem, joint swelling, myalgias, neck pain and neck stiffness.  Skin:  Negative for color change, pallor, rash and wound.  Neurological:  Negative for dizziness, syncope, speech difficulty, weakness, light-headedness, numbness and headaches.  Hematological:  Does not bruise/bleed easily.  Psychiatric/Behavioral:  Negative for agitation, behavioral problems, confusion, hallucinations, self-injury, sleep disturbance and suicidal ideas. The patient is not nervous/anxious.    Immunization History  Administered Date(s) Administered   Influenza,inj,Quad PF,6+ Mos 06/17/2017   Influenza,inj,quad, With Preservative 05/23/2012, 05/23/2013, 05/23/2014   Pertinent  Health Maintenance Due  Topic Date Due   PAP SMEAR-Modifier  Never done   COLONOSCOPY (Pts 45-8yrs Insurance coverage will need to be confirmed)  Never done   MAMMOGRAM  Never done   INFLUENZA VACCINE  03/23/2021   Fall Risk  03/18/2021  Falls in the past year? 0  Number falls in past yr: 0  Injury with Fall? 0  Risk for fall due to : No Fall Risks  Follow up Falls evaluation completed   Functional Status Survey:    Vitals:   03/18/21 1347  BP: 120/86  Pulse: 73  Resp: 16  Temp: 98.4 F (36.9 C)  SpO2: 97%  Weight: 145 lb (65.8 kg)  Height: $Remove'5\' 5"'DyTFjqy$  (1.651 m)   Body mass index is 24.13 kg/m. Physical Exam Vitals reviewed.  Constitutional:      General: She is not in acute distress.    Appearance: Normal appearance. She is normal weight. She is not ill-appearing or diaphoretic.  HENT:     Head: Normocephalic.     Right Ear: Tympanic membrane, ear canal and external ear normal. There is no impacted cerumen.     Left Ear: Tympanic membrane, ear canal and external ear normal. There is no impacted cerumen.     Nose: Nose normal. No congestion or rhinorrhea.      Mouth/Throat:     Mouth: Mucous membranes are moist.     Pharynx: Oropharynx is clear. No oropharyngeal exudate or posterior oropharyngeal erythema.  Eyes:     General: No scleral icterus.       Right eye: No discharge.        Left eye: No discharge.     Extraocular Movements: Extraocular movements intact.     Conjunctiva/sclera: Conjunctivae normal.     Pupils: Pupils are equal, round, and reactive to light.  Neck:     Vascular: No carotid bruit.  Cardiovascular:     Rate and Rhythm: Normal rate and regular rhythm.     Pulses: Normal pulses.     Heart sounds: Murmur heard.    No friction rub. No gallop.  Pulmonary:     Effort: Pulmonary effort is normal. No respiratory distress.     Breath sounds: Normal breath sounds. No wheezing, rhonchi or rales.  Chest:     Chest wall: No tenderness.  Abdominal:     General: Bowel  sounds are normal. There is no distension.     Palpations: Abdomen is soft. There is no mass.     Tenderness: There is no abdominal tenderness. There is no right CVA tenderness, left CVA tenderness, guarding or rebound.  Musculoskeletal:        General: No swelling or tenderness. Normal range of motion.     Cervical back: Normal range of motion. No rigidity or tenderness.     Right lower leg: No edema.     Left lower leg: No edema.     Comments: Arthritic changes on finger joints no erythema or tenderness to palpation   Lymphadenopathy:     Cervical: No cervical adenopathy.  Skin:    General: Skin is warm and dry.     Coloration: Skin is not pale.     Findings: No bruising, erythema, lesion or rash.  Neurological:     Mental Status: She is alert and oriented to person, place, and time.     Cranial Nerves: No cranial nerve deficit.     Sensory: No sensory deficit.     Motor: No weakness.     Coordination: Coordination normal.     Gait: Gait normal.  Psychiatric:        Mood and Affect: Mood normal.        Speech: Speech normal.        Behavior:  Behavior normal.        Thought Content: Thought content normal.        Judgment: Judgment normal.    Labs reviewed: No results for input(s): NA, K, CL, CO2, GLUCOSE, BUN, CREATININE, CALCIUM, MG, PHOS in the last 8760 hours. No results for input(s): AST, ALT, ALKPHOS, BILITOT, PROT, ALBUMIN in the last 8760 hours. No results for input(s): WBC, NEUTROABS, HGB, HCT, MCV, PLT in the last 8760 hours. No results found for: TSH No results found for: HGBA1C No results found for: CHOL, HDL, LDLCALC, LDLDIRECT, TRIG, CHOLHDL  Significant Diagnostic Results in last 30 days:  No results found.  Assessment/Plan  1. Essential hypertension B/p well controlled  Continue on Losartan  - CBC with Differential/Platelet; Future - CMP with eGFR([facility]); Future - TSH; Future - Lipid panel; Future  2. Acquired hypothyroidism No recent TSH level for evaluation  - continue on levothyroxine  - TSH; Future  3. Generalized anxiety disorder Stable  Continue on diazepam and Paxil   4. Seasonal allergies Continue on OTC Loratadine as needed   5. Vitamin B12 deficiency Has had vitamin B 1 2  injection in the past - continue om vitamin B 12 supplement  - Vitamin B12; Future  6. Colon cancer screening Asymptomatic  Refer to GI for colonoscopy made aware specialist office will call to make appointment. - Ambulatory referral to Gastroenterology  7. Breast cancer screening by mammogram Asymptomatic  Made aware imaging center will call for appointment.request referral to GI in [place] or [place] area close to her home.  - MM DIGITAL SCREENING BILATERAL; Future  8. Cervical cancer screening In office pap smear offered but  Requested referral to Gyn has had some abnormal pap smear but was rechecked was normal. - Ambulatory referral to Gynecology  9. Alcohol abuse, in remission Status post cessation 6 weeks ago. - continue on Diazepam and AAA meetings.   10. Tobacco use disorder Current  smoker 1/2 pack per day since she was a teenager.   11. 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  Family/ staff Communication: Reviewed plan of care with patient verbalized understanding.   Labs/tests ordered:  - CBC with Differential/Platelet - CMP with eGFR([facility]) - TSH - Lipid panel - Vitamin B 12  Next Appointment : 6 months for medical management of chronic issues.Fasting Labs tomorrow or in a week.   Sandrea Hughs, NP

## 2021-03-18 NOTE — Patient Instructions (Signed)
Please get lab drawn at [facility] laboratory

## 2021-03-19 ENCOUNTER — Other Ambulatory Visit: Payer: Self-pay

## 2021-03-19 ENCOUNTER — Telehealth: Payer: Self-pay

## 2021-03-19 NOTE — Telephone Encounter (Signed)
[facility] referring for Cervical cancer screening. Called and left voicemail for patient to call back to be scheduled.

## 2021-03-23 ENCOUNTER — Other Ambulatory Visit: Payer: Federal, State, Local not specified - PPO

## 2021-03-23 ENCOUNTER — Other Ambulatory Visit: Payer: Self-pay

## 2021-03-23 DIAGNOSIS — I10 Essential (primary) hypertension: Secondary | ICD-10-CM

## 2021-03-23 DIAGNOSIS — E538 Deficiency of other specified B group vitamins: Secondary | ICD-10-CM

## 2021-03-23 DIAGNOSIS — Z1231 Encounter for screening mammogram for malignant neoplasm of breast: Secondary | ICD-10-CM

## 2021-03-23 DIAGNOSIS — R739 Hyperglycemia, unspecified: Secondary | ICD-10-CM | POA: Diagnosis not present

## 2021-03-23 MED ORDER — CLENPIQ 10-3.5-12 MG-GM -GM/160ML PO SOLN: 1.0000 | ORAL | 0 refills | Status: DC

## 2021-03-26 LAB — CBC WITH DIFFERENTIAL/PLATELET
Absolute Monocytes: 421 cells/uL (ref 200–950)
Basophils Absolute: 31 cells/uL (ref 0–200)
Basophils Relative: 0.6 %
Eosinophils Absolute: 182 cells/uL (ref 15–500)
Eosinophils Relative: 3.5 %
HCT: 47 % — ABNORMAL HIGH (ref 35.0–45.0)
Hemoglobin: 15.2 g/dL (ref 11.7–15.5)
Lymphs Abs: 1352 cells/uL (ref 850–3900)
MCH: 30.7 pg (ref 27.0–33.0)
MCHC: 32.3 g/dL (ref 32.0–36.0)
MCV: 94.9 fL (ref 80.0–100.0)
MPV: 10.5 fL (ref 7.5–12.5)
Monocytes Relative: 8.1 %
Neutro Abs: 3214 cells/uL (ref 1500–7800)
Neutrophils Relative %: 61.8 %
Platelets: 266 10*3/uL (ref 140–400)
RBC: 4.95 10*6/uL (ref 3.80–5.10)
RDW: 11.5 % (ref 11.0–15.0)
Total Lymphocyte: 26 %
WBC: 5.2 10*3/uL (ref 3.8–10.8)

## 2021-03-26 LAB — COMPLETE METABOLIC PANEL WITH GFR
AG Ratio: 2.1 (calc) (ref 1.0–2.5)
ALT: 8 U/L (ref 6–29)
AST: 18 U/L (ref 10–35)
Albumin: 4.5 g/dL (ref 3.6–5.1)
Alkaline phosphatase (APISO): 57 U/L (ref 37–153)
BUN: 10 mg/dL (ref 7–25)
CO2: 25 mmol/L (ref 20–32)
Calcium: 9.7 mg/dL (ref 8.6–10.4)
Chloride: 108 mmol/L (ref 98–110)
Creat: 0.64 mg/dL (ref 0.50–1.03)
Globulin: 2.1 g/dL (calc) (ref 1.9–3.7)
Glucose, Bld: 100 mg/dL — ABNORMAL HIGH (ref 65–99)
Potassium: 4 mmol/L (ref 3.5–5.3)
Sodium: 142 mmol/L (ref 135–146)
Total Bilirubin: 0.5 mg/dL (ref 0.2–1.2)
Total Protein: 6.6 g/dL (ref 6.1–8.1)
eGFR: 107 mL/min/{1.73_m2} (ref >=60)

## 2021-03-26 LAB — TEST AUTHORIZATION

## 2021-03-26 LAB — LIPID PANEL
Cholesterol: 165 mg/dL (ref <200)
HDL: 54 mg/dL (ref >=50)
LDL Cholesterol (Calc): 89 mg/dL (calc)
Non-HDL Cholesterol (Calc): 111 mg/dL (calc) (ref <130)
Total CHOL/HDL Ratio: 3.1 (calc) (ref <5.0)
Triglycerides: 127 mg/dL (ref <150)

## 2021-03-26 LAB — HEMOGLOBIN A1C
Hgb A1c MFr Bld: 4.9 % of total Hgb (ref <5.7)
Mean Plasma Glucose: 94 mg/dL
eAG (mmol/L): 5.2 mmol/L

## 2021-03-26 LAB — VITAMIN B12: Vitamin B-12: 577 pg/mL (ref 200–1100)

## 2021-03-26 LAB — TSH: TSH: 1.07 mIU/L

## 2021-04-02 DIAGNOSIS — M79641 Pain in right hand: Secondary | ICD-10-CM | POA: Diagnosis not present

## 2021-04-02 DIAGNOSIS — M79642 Pain in left hand: Secondary | ICD-10-CM | POA: Diagnosis not present

## 2021-04-02 DIAGNOSIS — F5101 Primary insomnia: Secondary | ICD-10-CM

## 2021-04-02 DIAGNOSIS — F411 Generalized anxiety disorder: Secondary | ICD-10-CM

## 2021-04-03 MED ORDER — DIAZEPAM 2 MG PO TABS: 2.0000 mg | ORAL_TABLET | ORAL | 0 refills | Status: DC | PRN

## 2021-04-03 NOTE — Telephone Encounter (Signed)
No treatment agreement on file as this would be the first time Dinah is refilling rx.

## 2021-04-03 NOTE — Telephone Encounter (Signed)
Please schedule appointment to sign valium use contract prior to next refill.

## 2021-04-03 NOTE — Telephone Encounter (Signed)
Spoke with patient and scheduled an appointment for 04/10/2021 to sign treatment agreement

## 2021-04-07 MED ORDER — CLENPIQ 10-3.5-12 MG-GM -GM/160ML PO SOLN
ORAL | 0 refills | Status: DC
Start: 2021-04-07 — End: 2022-09-14

## 2021-04-07 NOTE — Progress Notes (Signed)
Clenpiq

## 2021-04-10 ENCOUNTER — Ambulatory Visit: Payer: Federal, State, Local not specified - PPO | Admitting: Family

## 2021-04-13 ENCOUNTER — Encounter: Payer: Self-pay | Admitting: Gastroenterology

## 2021-04-14 ENCOUNTER — Encounter
Admission: RE | Disposition: A | Discharge status: Home / Self Care | Payer: Self-pay | Source: Home / Self Care | Attending: Gastroenterology

## 2021-04-14 ENCOUNTER — Ambulatory Visit: Payer: Federal, State, Local not specified - PPO | Admitting: Certified Registered Nurse Anesthetist

## 2021-04-14 ENCOUNTER — Ambulatory Visit
Admission: RE | Admit: 2021-04-14 | Discharge: 2021-04-14 | Disposition: A | Discharge status: Home / Self Care | Payer: Federal, State, Local not specified - PPO | Attending: Gastroenterology | Admitting: Gastroenterology

## 2021-04-14 ENCOUNTER — Encounter: Payer: Self-pay | Admitting: Gastroenterology

## 2021-04-14 DIAGNOSIS — Z79890 Hormone replacement therapy (postmenopausal): Secondary | ICD-10-CM | POA: Insufficient documentation

## 2021-04-14 DIAGNOSIS — Z87891 Personal history of nicotine dependence: Secondary | ICD-10-CM | POA: Diagnosis not present

## 2021-04-14 DIAGNOSIS — Z880 Allergy status to penicillin: Secondary | ICD-10-CM | POA: Insufficient documentation

## 2021-04-14 DIAGNOSIS — Z882 Allergy status to sulfonamides status: Secondary | ICD-10-CM | POA: Diagnosis not present

## 2021-04-14 DIAGNOSIS — Z8371 Family history of colonic polyps: Secondary | ICD-10-CM | POA: Insufficient documentation

## 2021-04-14 DIAGNOSIS — Z7951 Long term (current) use of inhaled steroids: Secondary | ICD-10-CM | POA: Insufficient documentation

## 2021-04-14 DIAGNOSIS — K635 Polyp of colon: Secondary | ICD-10-CM

## 2021-04-14 DIAGNOSIS — Z79899 Other long term (current) drug therapy: Secondary | ICD-10-CM | POA: Diagnosis not present

## 2021-04-14 DIAGNOSIS — Z86010 Personal history of colonic polyps: Secondary | ICD-10-CM

## 2021-04-14 DIAGNOSIS — Z1211 Encounter for screening for malignant neoplasm of colon: Secondary | ICD-10-CM | POA: Diagnosis not present

## 2021-04-14 DIAGNOSIS — D122 Benign neoplasm of ascending colon: Secondary | ICD-10-CM | POA: Insufficient documentation

## 2021-04-14 HISTORY — PX: COLONOSCOPY: SHX5424

## 2021-04-14 HISTORY — DX: Hypothyroidism, unspecified: E03.9

## 2021-04-14 HISTORY — DX: Unspecified asthma, uncomplicated: J45.909

## 2021-04-14 LAB — POCT PREGNANCY, URINE: Preg Test, Ur: NEGATIVE

## 2021-04-14 SURGERY — COLONOSCOPY
Anesthesia: General

## 2021-04-14 MED ORDER — PROPOFOL 500 MG/50ML IV EMUL
INTRAVENOUS | Status: AC
  Filled 2021-04-14: qty 50

## 2021-04-14 MED ORDER — LIDOCAINE HCL (PF) 2 % IJ SOLN
INTRAMUSCULAR | Status: AC
  Filled 2021-04-14: qty 5

## 2021-04-14 MED ORDER — LIDOCAINE HCL (CARDIAC) PF 100 MG/5ML IV SOSY
PREFILLED_SYRINGE | INTRAVENOUS | Status: DC | PRN
  Administered 2021-04-14: 50 mg via INTRAVENOUS

## 2021-04-14 MED ORDER — PROPOFOL 10 MG/ML IV BOLUS
INTRAVENOUS | Status: DC | PRN
Start: 2021-04-14 — End: 2021-04-14
  Administered 2021-04-14: 120 ug/kg/min via INTRAVENOUS

## 2021-04-14 MED ORDER — SODIUM CHLORIDE 0.9 % IV SOLN: INTRAVENOUS | Status: DC

## 2021-04-14 MED ORDER — DEXMEDETOMIDINE (PRECEDEX) IN NS 20 MCG/5ML (4 MCG/ML) IV SYRINGE
PREFILLED_SYRINGE | INTRAVENOUS | Status: DC | PRN
  Administered 2021-04-14: 4 ug via INTRAVENOUS

## 2021-04-14 NOTE — Op Note (Signed)
[facility] Patient Name: Caitlin Weaver Procedure Date: 04/14/2021 9:13 AM MRN: [id number] Account #: [id number] Date of Birth: Nov 29, 1970 Admit Type: Outpatient Age: 50 Room: [facility] ENDO ROOM 2 Gender: Female Note Status: Finalized Procedure:             Colonoscopy Indications:           Surveillance: Personal history of colonic polyps                         (unknown histology) on last colonoscopy 3 years ago Providers:             Jonathon Bellows MD, MD Referring MD:          Sandrea Hughs (Referring MD) Medicines:             Monitored Anesthesia Care Complications:         No immediate complications. Procedure:             Pre-Anesthesia Assessment:                        - Prior to the procedure, a History and Physical was                         performed, and patient medications, allergies and                         sensitivities were reviewed. The patient's tolerance                         of previous anesthesia was reviewed.                        - The risks and benefits of the procedure and the                         sedation options and risks were discussed with the                         patient. All questions were answered and informed                         consent was obtained.                        - ASA Grade Assessment: II - A patient with mild                         systemic disease.                        After obtaining informed consent, the colonoscope was                         passed under direct vision. Throughout the procedure,                         the patient's blood pressure, pulse, and oxygen                         saturations were monitored  continuously. The                         Colonoscope was introduced through the anus and                         advanced to the the cecum, identified by the                         appendiceal orifice. The colonoscopy was performed                         with ease.  The patient tolerated the procedure well.                         The quality of the bowel preparation was good. Findings:      The perianal and digital rectal examinations were normal.      Two sessile polyps were found in the ascending colon. The polyps were 4       to 5 mm in size. These polyps were removed with a cold snare. Resection       and retrieval were complete.      The exam was otherwise without abnormality on direct and retroflexion       views. Impression:            - Two 4 to 5 mm polyps in the ascending colon, removed                         with a cold snare. Resected and retrieved.                        - The examination was otherwise normal on direct and                         retroflexion views. Recommendation:        - Discharge patient to home (with escort).                        - Resume previous diet.                        - Continue present medications.                        - Await pathology results.                        - Repeat colonoscopy for surveillance based on                         pathology results. Procedure Code(s):     --- Professional ---                        [phone], Colonoscopy, flexible; with removal of                         tumor(s), polyp(s), or other lesion(s) by snare  technique Diagnosis Code(s):     --- Professional ---                        Z86.010, Personal history of colonic polyps                        K63.5, Polyp of colon CPT copyright 2019 American Medical Association. All rights reserved. The codes documented in this report are preliminary and upon coder review may  be revised to meet current compliance requirements. Jonathon Bellows, MD Jonathon Bellows MD, MD 04/14/2021 9:40:15 AM This report has been signed electronically. Number of Addenda: 0 Note Initiated On: 04/14/2021 9:13 AM Scope Withdrawal Time: 0 hours 14 minutes 3 seconds  Total Procedure Duration: 0 hours 16 minutes 20 seconds  Estimated  Blood Loss:  Estimated blood loss: none.      [facility]

## 2021-04-14 NOTE — Transfer of Care (Signed)
Immediate Anesthesia Transfer of Care Note  Patient: Caitlin Weaver  Procedure(s) Performed: COLONOSCOPY  Patient Location: Endoscopy Unit  Anesthesia Type:General  Level of Consciousness: drowsy  Airway & Oxygen Therapy: Patient Spontanous Breathing  Post-op Assessment: Report given to RN and Post -op Vital signs reviewed and stable  Post vital signs: Reviewed and stable  Last Vitals:  Vitals Value Taken Time  BP 89/54 04/14/21 0942  Temp 36.4 C 04/14/21 0942  Pulse 62 04/14/21 0942  Resp 24 04/14/21 0942  SpO2 95 % 04/14/21 0942    Last Pain:  Vitals:   04/14/21 0942  TempSrc: Temporal  PainSc: Asleep         Complications: No notable events documented.

## 2021-04-14 NOTE — Anesthesia Postprocedure Evaluation (Signed)
Anesthesia Post Note  Patient: Caitlin Weaver  Procedure(s) Performed: COLONOSCOPY  Patient location during evaluation: Phase II Anesthesia Type: General Level of consciousness: awake and alert, awake and oriented Pain management: pain level controlled Vital Signs Assessment: post-procedure vital signs reviewed and stable Respiratory status: spontaneous breathing, nonlabored ventilation and respiratory function stable Cardiovascular status: blood pressure returned to baseline and stable Postop Assessment: no apparent nausea or vomiting Anesthetic complications: no   No notable events documented.   Last Vitals:  Vitals:   04/14/21 0952 04/14/21 1002  BP: 112/71 134/87  Pulse: 60 61  Resp: 18 15  Temp:    SpO2: 97% 100%    Last Pain:  Vitals:   04/14/21 1002  TempSrc:   PainSc: 0-No pain                 Phill Mutter

## 2021-04-14 NOTE — Anesthesia Preprocedure Evaluation (Signed)
Anesthesia Evaluation  Patient identified by MRN, date of birth, ID band Patient awake    Reviewed: Allergy & Precautions, NPO status , Patient's Chart, lab work & pertinent test results  Airway Mallampati: II  TM Distance: >3 FB Neck ROM: Full    Dental no notable dental hx.    Pulmonary asthma , former smoker,    Pulmonary exam normal        Cardiovascular hypertension, Pt. on medications Normal cardiovascular exam+ Valvular Problems/Murmurs      Neuro/Psych PSYCHIATRIC DISORDERS Anxiety Depression negative neurological ROS     GI/Hepatic negative GI ROS, Neg liver ROS, Bowel prep,  Endo/Other  Hypothyroidism   Renal/GU negative Renal ROS  negative genitourinary   Musculoskeletal negative musculoskeletal ROS (+)   Abdominal   Peds negative pediatric ROS (+)  Hematology negative hematology ROS (+)   Anesthesia Other Findings Alcohol abuse    Anxiety    Depression    Heart murmur    Hemangioma of lip    History of colonoscopy 2019   History of mammogram 2021   History of Papanicolaou smear of cervix 2019   Hypertension       Reproductive/Obstetrics negative OB ROS                            Anesthesia Physical Anesthesia Plan  ASA: 2  Anesthesia Plan: General   Post-op Pain Management:    Induction: Intravenous  PONV Risk Score and Plan: 2 and Propofol infusion and TIVA  Airway Management Planned: Natural Airway and Nasal Cannula  Additional Equipment:   Intra-op Plan:   Post-operative Plan:   Informed Consent: I have reviewed the patients History and Physical, chart, labs and discussed the procedure including the risks, benefits and alternatives for the proposed anesthesia with the patient or authorized representative who has indicated his/her understanding and acceptance.       Plan Discussed with: CRNA, Anesthesiologist and Surgeon  Anesthesia Plan Comments:          Anesthesia Quick Evaluation

## 2021-04-14 NOTE — H&P (Signed)
Jonathon Bellows, MD [address], [place], [place], [place], [address] [address], [place], [place], [place], [address] Phone: [phone]  Fax: [phone]  Primary Care Physician:  Sandrea Hughs, NP   Pre-Procedure History & Physical: HPI:  Caitlin Weaver is a 50 y.o. female is here for an colonoscopy.   Past Medical History:  Diagnosis Date   Alcohol abuse    Anxiety    Asthma    Depression    Heart murmur    Hemangioma of lip    History of colonoscopy 2019   History of mammogram 2021   History of Papanicolaou smear of cervix 2019   Hypertension    Hypothyroidism     Past Surgical History:  Procedure Laterality Date   ANKLE SURGERY  2019   facial hemagioma      LAPAROSCOPIC GASTRIC SLEEVE RESECTION  07/2020   THYROIDECTOMY  2011   TONSILLECTOMY      Prior to Admission medications   Medication Sig Start Date End Date Taking? Authorizing Provider  levothyroxine (SYNTHROID) 137 MCG tablet Take 137 mcg by mouth daily before breakfast.   Yes [provider]  losartan (COZAAR) 100 MG tablet Take 100 mg by mouth daily.   Yes [provider]  Multiple Vitamins-Minerals (BARIATRIC MULTIVITAMINS/IRON PO) Take 1 capsule by mouth daily.   Yes [provider]  PARoxetine (PAXIL) 20 MG tablet Take 1 tablet (20 mg total) by mouth daily. 03/18/21  Yes Ngetich, Dinah C, NP  albuterol (VENTOLIN HFA) 108 (90 Base) MCG/ACT inhaler Inhale 1-2 puffs into the lungs every 6 (six) hours as needed for wheezing or shortness of breath.    [provider]  Cyanocobalamin (VITAMIN B-12 PO) Take 1 capsule by mouth daily.    [provider]  diazepam (VALIUM) 2 MG tablet Take 1 tablet (2 mg total) by mouth as needed for anxiety. 04/03/21   Ngetich, Nelda Bucks, NP  Sod Picosulfate-Mag Ox-Cit Acd (CLENPIQ) 10-3.5-12 MG-GM -GM/160ML SOLN Take 1 bottle at 5 PM followed by five 8 oz cups of water and repeat 5 hours before procedure. 04/07/21   Jonathon Bellows, MD     Allergies as of 03/19/2021 - Review Complete 03/18/2021  Allergen Reaction Noted   Penicillins  03/18/2021   Sulfa antibiotics Hives 03/18/2021    Family History  Problem Relation Age of Onset   Kidney disease Mother     Social History   Socioeconomic History   Marital status: Single    Spouse name: Not on file   Number of children: Not on file   Years of education: Not on file   Highest education level: Not on file  Occupational History   Not on file  Tobacco Use   Smoking status: Former    Packs/day: 0.50    Years: 30.00    Pack years: 15.00    Types: Cigarettes   Smokeless tobacco: Never  Vaping Use   Vaping Use: Never used  Substance and Sexual Activity   Alcohol use: Never   Drug use: Never   Sexual activity: Not on file  Other Topics Concern   Not on file  Social History Narrative   Tobacco use, amount per day now: 1/2 Pack   Past tobacco use, amount per day:    How many years did you use tobacco: 30 years   Alcohol use (drinks per week): None.   Diet:   Do you drink/eat things with caffeine: Yes   Marital status:  Single  What year were you married?   Do you live in a house, apartment, assisted living, condo, trailer, etc.? House   Is it one or more stories? 1   How many persons live in your home? 2   Do you have pets in your home?( please list) 2 Dogs.   Highest Level of education completed? Some college.   Current or past profession: [employer]   Do you exercise? Yes                                  Type and how often? Walking everyday.    Do you have a living will? Yes   Do you have a DNR form?  No                                 If not, do you want to discuss one?   Do you have signed POA/HPOA forms? No                       If so, please bring to you appointment      Do you have any difficulty bathing or dressing yourself? No   Do you have any difficulty preparing food or eating? No   Do you have any  difficulty managing your medications? No   Do you have any difficulty managing your finances? No   Do you have any difficulty affording your medications? No   Social Determinants of Radio broadcast assistant Strain: Not on file  Food Insecurity: Not on file  Transportation Needs: Not on file  Physical Activity: Not on file  Stress: Not on file  Social Connections: Not on file  Intimate Partner Violence: Not on file    Review of Systems: See HPI, otherwise negative ROS  Physical Exam: BP 124/86   Pulse 73   Temp 98.1 F (36.7 C) (Temporal)   Resp 18   Ht 5' (1.524 m)   Wt 63.5 kg   BMI 27.34 kg/m  General:   Alert,  pleasant and cooperative in NAD Head:  Normocephalic and atraumatic. Neck:  Supple; no masses or thyromegaly. Lungs:  Clear throughout to auscultation, normal respiratory effort.    Heart:  +S1, +S2, Regular rate and rhythm, No edema. Abdomen:  Soft, nontender and nondistended. Normal bowel sounds, without guarding, and without rebound.   Neurologic:  Alert and  oriented x4;  grossly normal neurologically.  Impression/Plan: Caitlin Weaver is here for an colonoscopy to be performed for surveillance due to prior history of colon polyps - unknown etiology  Risks, benefits, limitations, and alternatives regarding  colonoscopy have been reviewed with the patient.  Questions have been answered.  All parties agreeable.   Jonathon Bellows, MD  04/14/2021, 9:09 AM

## 2021-04-15 ENCOUNTER — Encounter: Payer: Self-pay | Admitting: Gastroenterology

## 2021-04-15 ENCOUNTER — Ambulatory Visit: Payer: Federal, State, Local not specified - PPO | Admitting: Family

## 2021-04-15 LAB — SURGICAL PATHOLOGY

## 2021-04-17 ENCOUNTER — Encounter: Payer: Federal, State, Local not specified - PPO | Admitting: Obstetrics

## 2021-04-28 DIAGNOSIS — M159 Polyosteoarthritis, unspecified: Secondary | ICD-10-CM | POA: Diagnosis not present

## 2021-04-28 DIAGNOSIS — M79641 Pain in right hand: Secondary | ICD-10-CM | POA: Diagnosis not present

## 2021-04-28 DIAGNOSIS — M79642 Pain in left hand: Secondary | ICD-10-CM | POA: Diagnosis not present

## 2021-04-28 DIAGNOSIS — M542 Cervicalgia: Secondary | ICD-10-CM | POA: Diagnosis not present

## 2021-04-29 ENCOUNTER — Ambulatory Visit: Payer: Federal, State, Local not specified - PPO | Admitting: Family

## 2021-05-05 ENCOUNTER — Ambulatory Visit: Payer: Federal, State, Local not specified - PPO | Admitting: Family

## 2021-05-05 DIAGNOSIS — Z20822 Contact with and (suspected) exposure to covid-19: Secondary | ICD-10-CM | POA: Diagnosis not present

## 2021-05-07 DIAGNOSIS — Z20822 Contact with and (suspected) exposure to covid-19: Secondary | ICD-10-CM | POA: Diagnosis not present

## 2021-05-13 ENCOUNTER — Encounter: Payer: Federal, State, Local not specified - PPO | Admitting: Obstetrics

## 2021-05-28 DIAGNOSIS — M6281 Muscle weakness (generalized): Secondary | ICD-10-CM | POA: Diagnosis not present

## 2021-05-28 DIAGNOSIS — M542 Cervicalgia: Secondary | ICD-10-CM | POA: Diagnosis not present

## 2021-06-01 ENCOUNTER — Ambulatory Visit: Payer: Federal, State, Local not specified - PPO | Admitting: Family

## 2021-06-02 ENCOUNTER — Ambulatory Visit: Payer: Federal, State, Local not specified - PPO | Admitting: Internal Medicine

## 2021-06-04 DIAGNOSIS — M542 Cervicalgia: Secondary | ICD-10-CM | POA: Diagnosis not present

## 2021-06-05 ENCOUNTER — Ambulatory Visit: Payer: Federal, State, Local not specified - PPO | Admitting: Family

## 2021-06-08 ENCOUNTER — Ambulatory Visit: Payer: Federal, State, Local not specified - PPO | Admitting: Family

## 2021-06-08 ENCOUNTER — Encounter: Payer: Self-pay | Admitting: Family

## 2021-06-08 ENCOUNTER — Other Ambulatory Visit: Payer: Self-pay

## 2021-06-08 VITALS — BP 140/82 | HR 72 | Temp 96.0°F | Ht 60.0 in | Wt 142.2 lb

## 2021-06-08 DIAGNOSIS — I10 Essential (primary) hypertension: Secondary | ICD-10-CM

## 2021-06-08 DIAGNOSIS — Z23 Encounter for immunization: Secondary | ICD-10-CM

## 2021-06-08 DIAGNOSIS — F411 Generalized anxiety disorder: Secondary | ICD-10-CM | POA: Diagnosis not present

## 2021-06-08 DIAGNOSIS — F5101 Primary insomnia: Secondary | ICD-10-CM | POA: Diagnosis not present

## 2021-06-08 MED ORDER — LOSARTAN POTASSIUM 100 MG PO TABS
100.0000 mg | ORAL_TABLET | Freq: Every day | ORAL | 1 refills | Status: AC
Start: 2021-06-08 — End: ?

## 2021-06-08 MED ORDER — DIAZEPAM 2 MG PO TABS: 2.0000 mg | ORAL_TABLET | ORAL | 3 refills | Status: DC | PRN

## 2021-06-08 NOTE — Patient Instructions (Signed)
-   continue on Losartan

## 2021-06-08 NOTE — Progress Notes (Signed)
Provider: Marlowe Sax FNP-C  Angeles Paolucci, Nelda Bucks, NP  Patient Care Team: Elowyn Raupp, Nelda Bucks, NP as PCP - General (Family Medicine)  Extended Emergency Contact Information Primary Emergency Contact: Borg,Barbara Address: [address]          [place], [place] [address] [name] of [place] Mobile Phone: [phone] Relation: Mother Preferred language: English Interpreter needed? No  Code Status:  DNR Goals of care: Advanced Directive information Advanced Directives 04/14/2021  Does Patient Have a Medical Advance Directive? Yes  Type of Advance Directive -  Does patient want to make changes to medical advance directive? -     Chief Complaint  Patient presents with   Follow-up    Follow up on BP medication, diabetes medication and anxiety medication. Patient would like to see about reduction in bp medication strength. She is doing ok on other medications. Patient states that taking bp med daily causes low bp and so she has started taking it every other day. Patient last took medication last night.   Health Maintenance    COVID vaccine, Tetanus/tdap, pap smeat, Zoster vaccine, flu vaccine, mammogram    HPI:  Pt is a 50 y.o. female seen today for an acute visit for evaluation of follow up high blood pressure.states B/p readings at home range in 110's/60's - 130's/80's .slightly elevated here at the office 140/82.wanted B/p medication reduced states sometimes does not take her B/P medication daily as directed. Takes every other day.Took it  last night.   Past Medical History:  Diagnosis Date   Alcohol abuse    Anxiety    Asthma    Depression    Heart murmur    Hemangioma of lip    History of colonoscopy 2019   History of mammogram 2021   History of Papanicolaou smear of cervix 2019   Hypertension    Hypothyroidism    Past Surgical History:  Procedure Laterality Date   ANKLE SURGERY  2019   COLONOSCOPY N/A 04/14/2021   Procedure: COLONOSCOPY;  Surgeon: Jonathon Bellows,  MD;  Location: [facility] ENDOSCOPY;  Service: Gastroenterology;  Laterality: N/A;   facial hemagioma      LAPAROSCOPIC GASTRIC SLEEVE RESECTION  07/2020   THYROIDECTOMY  2011   TONSILLECTOMY      Allergies  Allergen Reactions   Penicillin G Other (See Comments)   Penicillins    Sulfa Antibiotics Hives    Outpatient Encounter Medications as of 06/08/2021  Medication Sig   albuterol (VENTOLIN HFA) 108 (90 Base) MCG/ACT inhaler Inhale 1-2 puffs into the lungs every 6 (six) hours as needed for wheezing or shortness of breath.   Cyanocobalamin (VITAMIN B-12 PO) Take 1 capsule by mouth daily.   diazepam (VALIUM) 2 MG tablet Take 1 tablet (2 mg total) by mouth as needed for anxiety.   levothyroxine (SYNTHROID) 137 MCG tablet Take 137 mcg by mouth daily before breakfast.   losartan (COZAAR) 100 MG tablet Take 100 mg by mouth daily.   Multiple Vitamins-Minerals (BARIATRIC MULTIVITAMINS/IRON PO) Take 1 capsule by mouth daily.   PARoxetine (PAXIL) 20 MG tablet Take 1 tablet (20 mg total) by mouth daily.   Sod Picosulfate-Mag Ox-Cit Acd (CLENPIQ) 10-3.5-12 MG-GM -GM/160ML SOLN Take 1 bottle at 5 PM followed by five 8 oz cups of water and repeat 5 hours before procedure.   No facility-administered encounter medications on file as of 06/08/2021.    Review of Systems  Constitutional:  Negative for appetite change, chills, fatigue, fever and unexpected weight change.  Eyes:  Negative for pain, discharge, redness, itching and visual disturbance.  Respiratory:  Negative for cough, chest tightness, shortness of breath and wheezing.   Cardiovascular:  Negative for chest pain, palpitations and leg swelling.  Gastrointestinal:  Negative for abdominal distention, abdominal pain, constipation, diarrhea, nausea and vomiting.  Musculoskeletal:  Negative for arthralgias, back pain, gait problem, joint swelling, myalgias, neck pain and neck stiffness.  Skin:  Negative for color change, pallor and rash.   Neurological:  Negative for dizziness, speech difficulty, weakness, light-headedness, numbness and headaches.   Immunization History  Administered Date(s) Administered   Influenza,inj,Quad PF,6+ Mos 06/17/2017   Influenza,inj,quad, With Preservative 05/23/2012, 05/23/2013, 05/23/2014   Pertinent  Health Maintenance Due  Topic Date Due   PAP SMEAR-Modifier  Never done   MAMMOGRAM  Never done   INFLUENZA VACCINE  03/23/2021   COLONOSCOPY (Pts 45-86yrs Insurance coverage will need to be confirmed)  04/14/2028   Fall Risk  06/08/2021 03/18/2021  Falls in the past year? 0 0  Number falls in past yr: 0 0  Injury with Fall? 0 0  Risk for fall due to : No Fall Risks No Fall Risks  Follow up Falls evaluation completed Falls evaluation completed   Functional Status Survey:    Vitals:   06/08/21 1102  BP: 140/82  Pulse: 72  Temp: (!) 96 F (35.6 C)  SpO2: 97%  Weight: 142 lb 3.2 oz (64.5 kg)  Height: 5' (1.524 m)   Body mass index is 27.77 kg/m. Physical Exam Vitals reviewed.  Constitutional:      General: She is not in acute distress.    Appearance: Normal appearance. She is overweight. She is not ill-appearing or diaphoretic.  HENT:     Head: Normocephalic.     Mouth/Throat:     Mouth: Mucous membranes are moist.     Pharynx: Oropharynx is clear. No oropharyngeal exudate or posterior oropharyngeal erythema.  Eyes:     General: No scleral icterus.       Right eye: No discharge.        Left eye: No discharge.     Conjunctiva/sclera: Conjunctivae normal.     Pupils: Pupils are equal, round, and reactive to light.  Neck:     Vascular: No carotid bruit.  Cardiovascular:     Rate and Rhythm: Normal rate and regular rhythm.     Pulses: Normal pulses.     Heart sounds: Normal heart sounds. No murmur heard.   No friction rub. No gallop.  Pulmonary:     Effort: Pulmonary effort is normal. No respiratory distress.     Breath sounds: Normal breath sounds. No wheezing, rhonchi  or rales.  Chest:     Chest wall: No tenderness.  Abdominal:     General: Bowel sounds are normal. There is no distension.     Palpations: Abdomen is soft. There is no mass.     Tenderness: There is no abdominal tenderness. There is no right CVA tenderness, left CVA tenderness, guarding or rebound.  Musculoskeletal:        General: No swelling or tenderness. Normal range of motion.     Cervical back: Normal range of motion. No rigidity or tenderness.     Right lower leg: No edema.     Left lower leg: No edema.  Lymphadenopathy:     Cervical: No cervical adenopathy.  Skin:    General: Skin is warm and dry.     Coloration: Skin is not pale.  Findings: No bruising, erythema, lesion or rash.  Neurological:     Mental Status: She is alert and oriented to person, place, and time.     Cranial Nerves: No cranial nerve deficit.     Sensory: No sensory deficit.     Motor: No weakness.     Coordination: Coordination normal.     Gait: Gait normal.  Psychiatric:        Mood and Affect: Mood normal.        Speech: Speech normal.        Behavior: Behavior normal.        Thought Content: Thought content normal.        Judgment: Judgment normal.    Labs reviewed: Recent Labs    03/23/21 0852  NA 142  K 4.0  CL 108  CO2 25  GLUCOSE 100*  BUN 10  CREATININE 0.64  CALCIUM 9.7   Recent Labs    03/23/21 0852  AST 18  ALT 8  BILITOT 0.5  PROT 6.6   Recent Labs    03/23/21 0852  WBC 5.2  NEUTROABS 3,214  HGB 15.2  HCT 47.0*  MCV 94.9  PLT 266   Lab Results  Component Value Date   TSH 1.07 03/23/2021   Lab Results  Component Value Date   HGBA1C 4.9 03/23/2021   Lab Results  Component Value Date   CHOL 165 03/23/2021   HDL 54 03/23/2021   LDLCALC 89 03/23/2021   TRIG 127 03/23/2021   CHOLHDL 3.1 03/23/2021    Significant Diagnostic Results in last 30 days:  No results found.  Assessment/Plan 1. Essential hypertension B/p slightly elevated today.Home  readings controlled. - continue to monitor B/p at home and record on log.Advised to upload readings on MyChart  Continue losartan.will reduce to 50 mg tablet if remains stable - losartan (COZAAR) 100 MG tablet; Take 1 tablet (100 mg total) by mouth daily.  Dispense: 90 tablet; Refill: 1  2. Need for influenza vaccination Afebrile  Flut shot administered by CMA no acute reaction reported.  - Flu Vaccine QUAD 6+ mos PF IM (Fluarix Quad PF)  3. Generalized anxiety disorder continue on valium  - diazepam (VALIUM) 2 MG tablet; Take 1 tablet (2 mg total) by mouth as needed for anxiety.  Dispense: 30 tablet; Refill: 3  4. Primary insomnia Valium effective - diazepam (VALIUM) 2 MG tablet; Take 1 tablet (2 mg total) by mouth as needed for anxiety.  Dispense: 30 tablet; Refill: 3  Family/ staff Communication: Reviewed plan of care with patient verbalized understanding   Labs/tests ordered: None   Next Appointment: As needed if symptoms worsen or fail to improve    Sandrea Hughs, NP

## 2021-06-15 ENCOUNTER — Ambulatory Visit: Payer: Federal, State, Local not specified - PPO | Admitting: Obstetrics

## 2021-06-15 ENCOUNTER — Other Ambulatory Visit: Payer: Self-pay

## 2021-06-15 ENCOUNTER — Other Ambulatory Visit (HOSPITAL_COMMUNITY)
Admission: RE | Admit: 2021-06-15 | Discharge: 2021-06-15 | Disposition: A | Discharge status: Home / Self Care | Payer: Federal, State, Local not specified - PPO | Source: Ambulatory Visit | Attending: Obstetrics | Admitting: Obstetrics

## 2021-06-15 ENCOUNTER — Encounter: Payer: Self-pay | Admitting: Obstetrics

## 2021-06-15 VITALS — BP 122/78 | Ht 65.0 in | Wt 146.0 lb

## 2021-06-15 DIAGNOSIS — M542 Cervicalgia: Secondary | ICD-10-CM | POA: Diagnosis not present

## 2021-06-15 DIAGNOSIS — Z124 Encounter for screening for malignant neoplasm of cervix: Secondary | ICD-10-CM | POA: Insufficient documentation

## 2021-06-15 DIAGNOSIS — Z01419 Encounter for gynecological examination (general) (routine) without abnormal findings: Secondary | ICD-10-CM | POA: Diagnosis not present

## 2021-06-15 DIAGNOSIS — Z1231 Encounter for screening mammogram for malignant neoplasm of breast: Secondary | ICD-10-CM | POA: Diagnosis not present

## 2021-06-15 NOTE — Progress Notes (Signed)
Gynecology Annual Exam  PCP: Ngetich, Nelda Bucks, NP  Chief Complaint:  Chief Complaint  Patient presents with   Annual Exam    History of Present Illness:Patient is a 50 y.o. [phone] presents for annual exam. The patient has no complaints today.   LMP: Patient's last menstrual period was 11/03/2020 (approximate).she has gone months without a period over the last year. When she dos cycle, the bleeding is heavy, and she has to change a pad or tampon often. She passes some clots as well. She does not have hot flashes, or night sweats.  Caitlin Weaver, is single; she underwent gastric sleeve surgery last year and has lost over 50 pounds. Her hx is significan for throid removal due to goiter, GI surgery, ETOH abuse (now sober) and tobacco use. She moved to [place] a few months ago from [place], and is a long time Chiropodist.  The patient is not currently sexually active. She denies dyspareunia.  The patient does not perform self breast exams.  There is no notable family history of breast or ovarian cancer in her family.  The patient wears seatbelts: yes.   The patient has regular exercise: yes.    The patient denies current symptoms of depression.     Review of Systems: ROS  Past Medical History:  Patient Active Problem List   Diagnosis Date Noted   Hemangioma of lip     Upper lip and nose      Past Surgical History:  Past Surgical History:  Procedure Laterality Date   ANKLE SURGERY  2019   COLONOSCOPY N/A 04/14/2021   Procedure: COLONOSCOPY;  Surgeon: Jonathon Bellows, MD;  Location: [facility] ENDOSCOPY;  Service: Gastroenterology;  Laterality: N/A;   facial hemagioma      LAPAROSCOPIC GASTRIC SLEEVE RESECTION  07/2020   THYROIDECTOMY  2011   TONSILLECTOMY      Gynecologic History:  Patient's last menstrual period was 11/03/2020 (approximate). Last Pap: a few years ago. Results were: NILM no abnormalities  Last mammogram: 2021 in [place] Results were: BI-RAD I  Obstetric History:  [id number]  Family History:  Family History  Problem Relation Age of Onset   Kidney disease Mother     Social History:  Social History   Socioeconomic History   Marital status: Single    Spouse name: Not on file   Number of children: Not on file   Years of education: Not on file   Highest education level: Not on file  Occupational History   Not on file  Tobacco Use   Smoking status: Every Day    Packs/day: 0.50    Years: 30.00    Pack years: 15.00    Types: Cigarettes   Smokeless tobacco: Never  Vaping Use   Vaping Use: Never used  Substance and Sexual Activity   Alcohol use: Never   Drug use: Never   Sexual activity: Not Currently  Other Topics Concern   Not on file  Social History Narrative   Tobacco use, amount per day now: 1/2 Pack   Past tobacco use, amount per day:    How many years did you use tobacco: 30 years   Alcohol use (drinks per week): None.   Diet:   Do you drink/eat things with caffeine: Yes   Marital status:  Single                                What  year were you married?   Do you live in a house, apartment, assisted living, condo, trailer, etc.? House   Is it one or more stories? 1   How many persons live in your home? 2   Do you have pets in your home?( please list) 2 Dogs.   Highest Level of education completed? Some college.   Current or past profession: [employer]   Do you exercise? Yes                                  Type and how often? Walking everyday.    Do you have a living will? Yes   Do you have a DNR form?  No                                 If not, do you want to discuss one?   Do you have signed POA/HPOA forms? No                       If so, please bring to you appointment      Do you have any difficulty bathing or dressing yourself? No   Do you have any difficulty preparing food or eating? No   Do you have any difficulty managing your medications? No   Do you have any difficulty managing your finances? No   Do you  have any difficulty affording your medications? No   Social Determinants of Radio broadcast assistant Strain: Not on file  Food Insecurity: Not on file  Transportation Needs: Not on file  Physical Activity: Not on file  Stress: Not on file  Social Connections: Not on file  Intimate Partner Violence: Not on file    Allergies:  Allergies  Allergen Reactions   Penicillin G Other (See Comments)   Penicillins    Sulfa Antibiotics Hives    Medications: Prior to Admission medications   Medication Sig Start Date End Date Taking? Authorizing Provider  albuterol (VENTOLIN HFA) 108 (90 Base) MCG/ACT inhaler Inhale 1-2 puffs into the lungs every 6 (six) hours as needed for wheezing or shortness of breath.   Yes [provider]  Cyanocobalamin (VITAMIN B-12 PO) Take 1 capsule by mouth daily.   Yes [provider]  diazepam (VALIUM) 2 MG tablet Take 1 tablet (2 mg total) by mouth as needed for anxiety. 06/08/21  Yes Ngetich, Dinah C, NP  levothyroxine (SYNTHROID) 137 MCG tablet Take 137 mcg by mouth daily before breakfast.   Yes [provider]  losartan (COZAAR) 100 MG tablet Take 1 tablet (100 mg total) by mouth daily. 06/08/21  Yes Ngetich, Dinah C, NP  Multiple Vitamins-Minerals (BARIATRIC MULTIVITAMINS/IRON PO) Take 1 capsule by mouth daily.   Yes [provider]  PARoxetine (PAXIL) 20 MG tablet Take 1 tablet (20 mg total) by mouth daily. 03/18/21  Yes Ngetich, Dinah C, NP  Sod Picosulfate-Mag Ox-Cit Acd (CLENPIQ) 10-3.5-12 MG-GM -GM/160ML SOLN Take 1 bottle at 5 PM followed by five 8 oz cups of water and repeat 5 hours before procedure. Patient not taking: Reported on 06/15/2021 04/07/21   Jonathon Bellows, MD    Physical Exam Vitals: Blood pressure 122/78, height 5\' 5"  (1.651 m), weight 146 lb (66.2 kg), last menstrual period 11/03/2020.  General: NAD HEENT: normocephalic, anicteric Thyroid: no enlargement, no palpable nodules  Pulmonary: No increased  work of breathing, CTAB Cardiovascular: RRR, distal pulses 2+ Breast: Breast symmetrical, no tenderness, no palpable nodules or masses, no skin or nipple retraction present, no nipple discharge.  No axillary or supraclavicular lymphadenopathy. Abdomen: NABS, soft, non-tender, non-distended.  Umbilicus without lesions.  No hepatomegaly, splenomegaly or masses palpable. No evidence of hernia  Genitourinary:  External: Normal external female genitalia.  Normal urethral meatus, normal Bartholin's and Skene's glands.    Vagina: Normal vaginal mucosa, no evidence of prolapse.    Cervix: Grossly normal in appearance, no bleeding  Uterus: Non-enlarged, mobile, normal contour.  No CMT  Adnexa: ovaries non-enlarged, no adnexal masses  Rectal: deferred  Lymphatic: no evidence of inguinal lymphadenopathy Extremities: no edema, erythema, or tenderness Neurologic: Grossly intact Psychiatric: mood appropriate, affect full  Female chaperone present for pelvic and breast  portions of the physical exam     Assessment: 50 y.o. [id number] routine annual exam  Plan: Problem List Items Addressed This Visit   None Visit Diagnoses     Breast cancer screening by mammogram    -  Primary   Relevant Orders   MM DIGITAL SCREENING BILATERAL   Women's annual routine gynecological examination       Cervical cancer screening       Relevant Orders   Cytology - PAP       1) Mammogram - recommend yearly screening mammogram.  Mammogram Was ordered today  2) STI screening  wasoffered and declined  3) ASCCP guidelines and rational discussed.  Patient opts for every 3 years screening interval  4) Osteoporosis  - per USPTF routine screening DEXA at age 80 -   Consider FDA-approved medical therapies in postmenopausal women and men aged 41 years and older, based on the following: a) A hip or vertebral (clinical or morphometric) fracture b) T-score ? -2.5 at the femoral neck or spine after appropriate evaluation  to exclude secondary causes C) Low bone mass (T-score between -1.0 and -2.5 at the femoral neck or spine) and a 10-year probability of a hip fracture ? 3% or a 10-year probability of a major osteoporosis-related fracture ? 20% based on the [place]-adapted WHO algorithm   5) Routine healthcare maintenance including cholesterol, diabetes screening discussed managed by PCP  6) Colonoscopy is up to date.  Screening recommended starting at age 70 for average risk individuals, age 42 for individuals deemed at increased risk (including African Americans) and recommended to continue until age 77.  For patient age 7-85 individualized approach is recommended.  Gold standard screening is via colonoscopy, Cologuard screening is an acceptable alternative for patient unwilling or unable to undergo colonoscopy.  "Colorectal cancer screening for average?risk adults: 2018 guideline update from the [place]: A Cancer Journal for Clinicians: Jan 19, 2017   7) Return in about 1 year (around 06/15/2022) for annual.   Imagene Riches, CNM  06/15/2021 12:55 PM   Mosetta Pigeon, [facility] 06/15/2021, 12:55 PM

## 2021-06-19 LAB — CYTOLOGY - PAP
Comment: NEGATIVE
Comment: NEGATIVE
Diagnosis: NEGATIVE
HPV 16: NEGATIVE
HPV 18 / 45: NEGATIVE
High risk HPV: POSITIVE — AB

## 2021-06-24 NOTE — Progress Notes (Signed)
Patient called and notified of pap smear results. She has had this result in the distant past. Discussed the low risk of any future problmes, and she will consider frequency of her pap smears and let us know at her next annual Exam. Imagene Riches, CNM  06/24/2021 3:04 PM

## 2021-07-02 ENCOUNTER — Other Ambulatory Visit: Payer: Self-pay | Admitting: Obstetrics

## 2021-07-02 DIAGNOSIS — Z1231 Encounter for screening mammogram for malignant neoplasm of breast: Secondary | ICD-10-CM

## 2021-07-07 ENCOUNTER — Other Ambulatory Visit: Payer: Self-pay

## 2021-07-07 MED ORDER — PAROXETINE HCL 20 MG PO TABS: 20.0000 mg | ORAL_TABLET | Freq: Every day | ORAL | 1 refills | Status: DC

## 2021-07-07 MED ORDER — LEVOTHYROXINE SODIUM 137 MCG PO TABS
137.0000 ug | ORAL_TABLET | Freq: Every day | ORAL | 1 refills | Status: DC
  Filled 2021-07-07: qty 90, 90d supply, fill #0

## 2021-07-09 ENCOUNTER — Other Ambulatory Visit: Payer: Self-pay | Admitting: Obstetrics

## 2021-07-09 DIAGNOSIS — Z1231 Encounter for screening mammogram for malignant neoplasm of breast: Secondary | ICD-10-CM

## 2021-08-12 ENCOUNTER — Other Ambulatory Visit: Payer: Self-pay

## 2021-08-12 ENCOUNTER — Ambulatory Visit
Admission: RE | Admit: 2021-08-12 | Discharge: 2021-08-12 | Disposition: A | Discharge status: Home / Self Care | Payer: Federal, State, Local not specified - PPO | Source: Ambulatory Visit | Attending: Obstetrics | Admitting: Obstetrics

## 2021-08-12 DIAGNOSIS — Z1231 Encounter for screening mammogram for malignant neoplasm of breast: Secondary | ICD-10-CM | POA: Diagnosis not present

## 2021-08-21 ENCOUNTER — Ambulatory Visit: Payer: Federal, State, Local not specified - PPO | Admitting: Family

## 2021-08-25 DIAGNOSIS — J3489 Other specified disorders of nose and nasal sinuses: Secondary | ICD-10-CM | POA: Diagnosis not present

## 2021-08-25 DIAGNOSIS — G5601 Carpal tunnel syndrome, right upper limb: Secondary | ICD-10-CM | POA: Diagnosis not present

## 2021-08-25 DIAGNOSIS — G5602 Carpal tunnel syndrome, left upper limb: Secondary | ICD-10-CM | POA: Diagnosis not present

## 2021-08-25 DIAGNOSIS — M65331 Trigger finger, right middle finger: Secondary | ICD-10-CM | POA: Diagnosis not present

## 2021-09-07 ENCOUNTER — Ambulatory Visit (INDEPENDENT_AMBULATORY_CARE_PROVIDER_SITE_OTHER): Payer: Federal, State, Local not specified - PPO | Admitting: Family

## 2021-09-07 ENCOUNTER — Encounter: Payer: Self-pay | Admitting: Family

## 2021-09-07 ENCOUNTER — Other Ambulatory Visit: Payer: Self-pay

## 2021-09-07 VITALS — BP 128/78 | HR 82 | Temp 98.7°F | Ht 65.0 in | Wt 151.6 lb

## 2021-09-07 DIAGNOSIS — F410 Panic disorder [episodic paroxysmal anxiety] without agoraphobia: Secondary | ICD-10-CM | POA: Diagnosis not present

## 2021-09-07 DIAGNOSIS — J339 Nasal polyp, unspecified: Secondary | ICD-10-CM

## 2021-09-07 DIAGNOSIS — J302 Other seasonal allergic rhinitis: Secondary | ICD-10-CM

## 2021-09-07 MED ORDER — PAROXETINE HCL 30 MG PO TABS: 30.0000 mg | ORAL_TABLET | Freq: Every day | ORAL | 0 refills | Status: AC

## 2021-09-07 NOTE — Progress Notes (Signed)
Provider: Marlowe Sax FNP-C  Cristen Bredeson, Nelda Bucks, NP  Patient Care Team: Myron Lona, Nelda Bucks, NP as PCP - General (Family Medicine)  Extended Emergency Contact Information Primary Emergency Contact: Caitlin Weaver Address: [address]          [place], [place] [address] [name] of [place] Mobile Phone: [phone] Relation: Mother Preferred language: English Interpreter needed? No  Code Status:  Full Code  Goals of care: Advanced Directive information Advanced Directives 04/14/2021  Does Patient Have a Medical Advance Directive? Yes  Type of Advance Directive -  Does patient want to make changes to medical advance directive? -     Chief Complaint  Patient presents with   Acute Visit    Patient would like to discuss medication and nasal polyps.     HPI:  Pt is a 51 y.o. female seen today for an acute visit to discuss medication and nasal polyps.states went to a specialist who looked at her nose noted polyps.she was referred to ENT for further evaluation. States left nare feels more narrow than the right.Has hx of fracture on the left.Also complains of nasal mucus drainage.has been using nasal flush. Has constant runny left nare and left eye. Takes allergy medication. Has Panic attacks and PTSD.states night mares of 9-11  States has required more diazepam since she still dealing with Quitting alcohol drinking.Has not had any relapse but gets panic attack every now and then requiring diazepam.request increase dosage of diazepam. But I have discussed option to increase her Paxil instead of diazepam.states does not want medication which will make her gain weight.     Past Medical History:  Diagnosis Date   Alcohol abuse    Anxiety    Asthma    Depression    Heart murmur    Hemangioma of lip    History of colonoscopy 2019   History of mammogram 2021   History of Papanicolaou smear of cervix 2019   Hypertension    Hypothyroidism    Past Surgical History:  Procedure  Laterality Date   ANKLE SURGERY  2019   BREAST BIOPSY Right 2010   neg   COLONOSCOPY N/A 04/14/2021   Procedure: COLONOSCOPY;  Surgeon: Jonathon Bellows, MD;  Location: [facility] ENDOSCOPY;  Service: Gastroenterology;  Laterality: N/A;   facial hemagioma      LAPAROSCOPIC GASTRIC SLEEVE RESECTION  07/2020   THYROIDECTOMY  2011   TONSILLECTOMY      Allergies  Allergen Reactions   Penicillin G Other (See Comments)   Penicillins    Sulfa Antibiotics Hives    Outpatient Encounter Medications as of 09/07/2021  Medication Sig   albuterol (VENTOLIN HFA) 108 (90 Base) MCG/ACT inhaler Inhale 1-2 puffs into the lungs every 6 (six) hours as needed for wheezing or shortness of breath.   Cyanocobalamin (VITAMIN B-12 PO) Take 1 capsule by mouth daily.   diazepam (VALIUM) 2 MG tablet Take 1 tablet (2 mg total) by mouth as needed for anxiety.   levothyroxine (SYNTHROID) 137 MCG tablet Take 1 tablet (137 mcg total) by mouth daily before breakfast.   losartan (COZAAR) 100 MG tablet Take 1 tablet (100 mg total) by mouth daily.   Multiple Vitamins-Minerals (BARIATRIC MULTIVITAMINS/IRON PO) Take 1 capsule by mouth daily.   PARoxetine (PAXIL) 20 MG tablet Take 1 tablet (20 mg total) by mouth daily.   Sod Picosulfate-Mag Ox-Cit Acd (CLENPIQ) 10-3.5-12 MG-GM -GM/160ML SOLN Take 1 bottle at 5 PM followed by five 8 oz cups of water and repeat 5 hours  before procedure.   No facility-administered encounter medications on file as of 09/07/2021.    Review of Systems  Constitutional:  Negative for appetite change, chills, fatigue, fever and unexpected weight change.  HENT:  Positive for rhinorrhea. Negative for congestion, dental problem, ear discharge, ear pain, facial swelling, hearing loss, nosebleeds, postnasal drip, sinus pressure, sinus pain, sneezing, sore throat, tinnitus and trouble swallowing.   Eyes:  Negative for pain, discharge, redness, itching and visual disturbance.  Respiratory:  Negative for cough, chest  tightness, shortness of breath and wheezing.   Skin:  Negative for color change, pallor and rash.  Neurological:  Negative for dizziness, syncope, weakness, light-headedness and headaches.  Psychiatric/Behavioral:  Negative for agitation, behavioral problems and sleep disturbance. The patient is not nervous/anxious.        Panic attack    Immunization History  Administered Date(s) Administered   Influenza,inj,Quad PF,6+ Mos 06/17/2017, 06/08/2021   Influenza,inj,quad, With Preservative 05/23/2012, 05/23/2013, 05/23/2014   Pertinent  Health Maintenance Due  Topic Date Due   MAMMOGRAM  08/13/2023   PAP SMEAR-Modifier  06/15/2024   COLONOSCOPY (Pts 45-24yrs Insurance coverage will need to be confirmed)  04/14/2028   INFLUENZA VACCINE  Completed   Fall Risk 03/18/2021 06/08/2021 09/07/2021  Falls in the past year? 0 0 0  Was there an injury with Fall? 0 0 0  Fall Risk Category Calculator 0 0 0  Fall Risk Category Low Low Low  Patient Fall Risk Level Low fall risk Low fall risk Low fall risk  Patient at Risk for Falls Due to No Fall Risks No Fall Risks No Fall Risks  Fall risk Follow up Falls evaluation completed Falls evaluation completed Falls evaluation completed;Education provided;Falls prevention discussed   Functional Status Survey:    Vitals:   09/07/21 1246  BP: 128/78  Pulse: 82  Temp: 98.7 F (37.1 C)  SpO2: 97%  Weight: 151 lb 9.6 oz (68.8 kg)  Height: 5\' 5"  (1.651 m)   Body mass index is 25.23 kg/m. Physical Exam Vitals reviewed.  Constitutional:      General: She is not in acute distress.    Appearance: Normal appearance. She is overweight. She is not ill-appearing or diaphoretic.  HENT:     Head: Normocephalic.     Right Ear: Tympanic membrane, ear canal and external ear normal. There is no impacted cerumen.     Left Ear: Tympanic membrane, ear canal and external ear normal. There is no impacted cerumen.     Nose: Nose normal. No congestion or rhinorrhea.      Right Nostril: No foreign body, epistaxis or occlusion.     Left Nostril: No foreign body or occlusion.     Right Turbinates: Not enlarged, swollen or pale.     Left Turbinates: Enlarged.     Right Sinus: No maxillary sinus tenderness or frontal sinus tenderness.     Left Sinus: No frontal sinus tenderness.     Mouth/Throat:     Mouth: Mucous membranes are moist.     Pharynx: Oropharynx is clear. No oropharyngeal exudate or posterior oropharyngeal erythema.  Eyes:     General: No scleral icterus.       Right eye: No discharge.        Left eye: No discharge.     Extraocular Movements: Extraocular movements intact.     Conjunctiva/sclera: Conjunctivae normal.     Pupils: Pupils are equal, round, and reactive to light.  Neck:     Vascular: No carotid bruit.  Cardiovascular:     Rate and Rhythm: Normal rate and regular rhythm.     Pulses: Normal pulses.     Heart sounds: Normal heart sounds. No murmur heard.   No friction rub. No gallop.  Pulmonary:     Effort: Pulmonary effort is normal. No respiratory distress.     Breath sounds: Normal breath sounds. No wheezing, rhonchi or rales.  Chest:     Chest wall: No tenderness.  Abdominal:     General: Bowel sounds are normal. There is no distension.     Palpations: Abdomen is soft. There is no mass.     Tenderness: There is no abdominal tenderness. There is no right CVA tenderness, left CVA tenderness, guarding or rebound.  Musculoskeletal:     Cervical back: Normal range of motion. No rigidity or tenderness.  Lymphadenopathy:     Cervical: No cervical adenopathy.  Skin:    General: Skin is warm and dry.     Coloration: Skin is not pale.     Findings: No erythema or rash.  Neurological:     Mental Status: She is alert.  Psychiatric:        Mood and Affect: Mood normal.        Speech: Speech normal.        Behavior: Behavior normal.        Thought Content: Thought content normal.        Judgment: Judgment normal.    Labs  reviewed: Recent Labs    03/23/21 0852  NA 142  K 4.0  CL 108  CO2 25  GLUCOSE 100*  BUN 10  CREATININE 0.64  CALCIUM 9.7   Recent Labs    03/23/21 0852  AST 18  ALT 8  BILITOT 0.5  PROT 6.6   Recent Labs    03/23/21 0852  WBC 5.2  NEUTROABS 3,214  HGB 15.2  HCT 47.0*  MCV 94.9  PLT 266   Lab Results  Component Value Date   TSH 1.07 03/23/2021   Lab Results  Component Value Date   HGBA1C 4.9 03/23/2021   Lab Results  Component Value Date   CHOL 165 03/23/2021   HDL 54 03/23/2021   LDLCALC 89 03/23/2021   TRIG 127 03/23/2021   CHOLHDL 3.1 03/23/2021    Significant Diagnostic Results in last 30 days:  MM 3D SCREEN BREAST BILATERAL  Result Date: 09/02/2021 CLINICAL DATA:  Screening. EXAM: DIGITAL SCREENING BILATERAL MAMMOGRAM WITH TOMOSYNTHESIS AND CAD TECHNIQUE: Bilateral screening digital craniocaudal and mediolateral oblique mammograms were obtained. Bilateral screening digital breast tomosynthesis was performed. The images were evaluated with computer-aided detection. COMPARISON:  None. ACR Breast Density Category b: There are scattered areas of fibroglandular density. FINDINGS: There are no findings suspicious for malignancy. IMPRESSION: No mammographic evidence of malignancy. A result letter of this screening mammogram will be mailed directly to the patient. RECOMMENDATION: Screening mammogram in one year. (Code:SM-B-01Y) BI-RADS CATEGORY  1: Negative. Electronically Signed   By: Fidela Salisbury M.D.   On: 09/02/2021 08:54   Assessment/Plan 1. Panic attack Has worsen with night mares.Has upcoming appointment with Psychiatry service  in Feb,2023. - Increase Paxil from 20 mg tablet to 30 mg tablet daily  - PARoxetine (PAXIL) 30 MG tablet; Take 1 tablet (30 mg total) by mouth daily.  Dispense: 90 tablet; Refill: 0 - notify provider or go to ED if symptoms worsen   2. Nasal polyp Has ENT referral   3. Seasonal allergies Continue OTC allergy  medication  as needed  Family/ staff Communication: Reviewed plan of care with patient verbalized understanding   Labs/tests ordered: None   Next Appointment: 4 months for medical management of chronic issues   Sandrea Hughs, NP

## 2021-09-09 DIAGNOSIS — J310 Chronic rhinitis: Secondary | ICD-10-CM | POA: Diagnosis not present

## 2021-09-09 DIAGNOSIS — J342 Deviated nasal septum: Secondary | ICD-10-CM | POA: Diagnosis not present

## 2021-09-09 DIAGNOSIS — J3489 Other specified disorders of nose and nasal sinuses: Secondary | ICD-10-CM | POA: Diagnosis not present

## 2021-09-09 DIAGNOSIS — J301 Allergic rhinitis due to pollen: Secondary | ICD-10-CM | POA: Diagnosis not present

## 2021-09-18 ENCOUNTER — Ambulatory Visit: Payer: Federal, State, Local not specified - PPO | Admitting: Family

## 2021-09-25 DIAGNOSIS — K130 Diseases of lips: Secondary | ICD-10-CM | POA: Diagnosis not present

## 2021-10-08 DIAGNOSIS — D225 Melanocytic nevi of trunk: Secondary | ICD-10-CM | POA: Diagnosis not present

## 2021-10-08 DIAGNOSIS — D2262 Melanocytic nevi of left upper limb, including shoulder: Secondary | ICD-10-CM | POA: Diagnosis not present

## 2021-10-08 DIAGNOSIS — D2272 Melanocytic nevi of left lower limb, including hip: Secondary | ICD-10-CM | POA: Diagnosis not present

## 2021-10-08 DIAGNOSIS — D2261 Melanocytic nevi of right upper limb, including shoulder: Secondary | ICD-10-CM | POA: Diagnosis not present

## 2021-10-13 ENCOUNTER — Ambulatory Visit: Admit: 2021-10-13 | Payer: Federal, State, Local not specified - PPO | Admitting: Otolaryngology

## 2021-10-13 SURGERY — SEPTOPLASTY, NOSE, WITH NASAL TURBINATE REDUCTION
Anesthesia: General | Laterality: Bilateral

## 2021-10-19 DIAGNOSIS — F5101 Primary insomnia: Secondary | ICD-10-CM

## 2021-10-19 DIAGNOSIS — F411 Generalized anxiety disorder: Secondary | ICD-10-CM

## 2021-10-20 MED ORDER — DIAZEPAM 2 MG PO TABS: 2.0000 mg | ORAL_TABLET | ORAL | 3 refills | Status: DC | PRN

## 2021-10-20 MED ORDER — LEVOTHYROXINE SODIUM 137 MCG PO TABS: 137.0000 ug | ORAL_TABLET | Freq: Every day | ORAL | 1 refills | Status: DC

## 2021-10-20 NOTE — Telephone Encounter (Signed)
Patient requested refill.  Pended Rx's and sent to [facility] for approval.

## 2021-11-09 DIAGNOSIS — Z13220 Encounter for screening for lipoid disorders: Secondary | ICD-10-CM | POA: Diagnosis not present

## 2021-11-09 DIAGNOSIS — I10 Essential (primary) hypertension: Secondary | ICD-10-CM | POA: Diagnosis not present

## 2021-11-09 DIAGNOSIS — F419 Anxiety disorder, unspecified: Secondary | ICD-10-CM | POA: Diagnosis not present

## 2021-11-09 DIAGNOSIS — E039 Hypothyroidism, unspecified: Secondary | ICD-10-CM | POA: Diagnosis not present

## 2021-12-24 DIAGNOSIS — M9903 Segmental and somatic dysfunction of lumbar region: Secondary | ICD-10-CM | POA: Diagnosis not present

## 2021-12-24 DIAGNOSIS — M5136 Other intervertebral disc degeneration, lumbar region: Secondary | ICD-10-CM | POA: Diagnosis not present

## 2021-12-24 DIAGNOSIS — M9901 Segmental and somatic dysfunction of cervical region: Secondary | ICD-10-CM | POA: Diagnosis not present

## 2021-12-24 DIAGNOSIS — M50322 Other cervical disc degeneration at C5-C6 level: Secondary | ICD-10-CM | POA: Diagnosis not present

## 2021-12-28 DIAGNOSIS — M50322 Other cervical disc degeneration at C5-C6 level: Secondary | ICD-10-CM | POA: Diagnosis not present

## 2021-12-28 DIAGNOSIS — M5136 Other intervertebral disc degeneration, lumbar region: Secondary | ICD-10-CM | POA: Diagnosis not present

## 2021-12-28 DIAGNOSIS — M9901 Segmental and somatic dysfunction of cervical region: Secondary | ICD-10-CM | POA: Diagnosis not present

## 2021-12-28 DIAGNOSIS — M9903 Segmental and somatic dysfunction of lumbar region: Secondary | ICD-10-CM | POA: Diagnosis not present

## 2021-12-29 DIAGNOSIS — M9901 Segmental and somatic dysfunction of cervical region: Secondary | ICD-10-CM | POA: Diagnosis not present

## 2021-12-29 DIAGNOSIS — M5136 Other intervertebral disc degeneration, lumbar region: Secondary | ICD-10-CM | POA: Diagnosis not present

## 2021-12-29 DIAGNOSIS — M9903 Segmental and somatic dysfunction of lumbar region: Secondary | ICD-10-CM | POA: Diagnosis not present

## 2021-12-29 DIAGNOSIS — M50322 Other cervical disc degeneration at C5-C6 level: Secondary | ICD-10-CM | POA: Diagnosis not present

## 2022-01-01 DIAGNOSIS — M5136 Other intervertebral disc degeneration, lumbar region: Secondary | ICD-10-CM | POA: Diagnosis not present

## 2022-01-01 DIAGNOSIS — M50322 Other cervical disc degeneration at C5-C6 level: Secondary | ICD-10-CM | POA: Diagnosis not present

## 2022-01-01 DIAGNOSIS — M9903 Segmental and somatic dysfunction of lumbar region: Secondary | ICD-10-CM | POA: Diagnosis not present

## 2022-01-01 DIAGNOSIS — M9901 Segmental and somatic dysfunction of cervical region: Secondary | ICD-10-CM | POA: Diagnosis not present

## 2022-01-04 DIAGNOSIS — M9903 Segmental and somatic dysfunction of lumbar region: Secondary | ICD-10-CM | POA: Diagnosis not present

## 2022-01-04 DIAGNOSIS — M5136 Other intervertebral disc degeneration, lumbar region: Secondary | ICD-10-CM | POA: Diagnosis not present

## 2022-01-04 DIAGNOSIS — M9901 Segmental and somatic dysfunction of cervical region: Secondary | ICD-10-CM | POA: Diagnosis not present

## 2022-01-04 DIAGNOSIS — M50322 Other cervical disc degeneration at C5-C6 level: Secondary | ICD-10-CM | POA: Diagnosis not present

## 2022-01-05 DIAGNOSIS — M9901 Segmental and somatic dysfunction of cervical region: Secondary | ICD-10-CM | POA: Diagnosis not present

## 2022-01-05 DIAGNOSIS — M50322 Other cervical disc degeneration at C5-C6 level: Secondary | ICD-10-CM | POA: Diagnosis not present

## 2022-01-05 DIAGNOSIS — M5136 Other intervertebral disc degeneration, lumbar region: Secondary | ICD-10-CM | POA: Diagnosis not present

## 2022-01-05 DIAGNOSIS — M9903 Segmental and somatic dysfunction of lumbar region: Secondary | ICD-10-CM | POA: Diagnosis not present

## 2022-01-08 DIAGNOSIS — M5136 Other intervertebral disc degeneration, lumbar region: Secondary | ICD-10-CM | POA: Diagnosis not present

## 2022-01-08 DIAGNOSIS — M9901 Segmental and somatic dysfunction of cervical region: Secondary | ICD-10-CM | POA: Diagnosis not present

## 2022-01-08 DIAGNOSIS — M9903 Segmental and somatic dysfunction of lumbar region: Secondary | ICD-10-CM | POA: Diagnosis not present

## 2022-01-08 DIAGNOSIS — M50322 Other cervical disc degeneration at C5-C6 level: Secondary | ICD-10-CM | POA: Diagnosis not present

## 2022-01-11 ENCOUNTER — Ambulatory Visit: Payer: Federal, State, Local not specified - PPO | Admitting: Family

## 2022-01-11 DIAGNOSIS — M9901 Segmental and somatic dysfunction of cervical region: Secondary | ICD-10-CM | POA: Diagnosis not present

## 2022-01-11 DIAGNOSIS — M5136 Other intervertebral disc degeneration, lumbar region: Secondary | ICD-10-CM | POA: Diagnosis not present

## 2022-01-11 DIAGNOSIS — M50322 Other cervical disc degeneration at C5-C6 level: Secondary | ICD-10-CM | POA: Diagnosis not present

## 2022-01-11 DIAGNOSIS — M9903 Segmental and somatic dysfunction of lumbar region: Secondary | ICD-10-CM | POA: Diagnosis not present

## 2022-01-13 DIAGNOSIS — M9901 Segmental and somatic dysfunction of cervical region: Secondary | ICD-10-CM | POA: Diagnosis not present

## 2022-01-13 DIAGNOSIS — M9903 Segmental and somatic dysfunction of lumbar region: Secondary | ICD-10-CM | POA: Diagnosis not present

## 2022-01-13 DIAGNOSIS — M50322 Other cervical disc degeneration at C5-C6 level: Secondary | ICD-10-CM | POA: Diagnosis not present

## 2022-01-13 DIAGNOSIS — M5136 Other intervertebral disc degeneration, lumbar region: Secondary | ICD-10-CM | POA: Diagnosis not present

## 2022-01-15 DIAGNOSIS — M50322 Other cervical disc degeneration at C5-C6 level: Secondary | ICD-10-CM | POA: Diagnosis not present

## 2022-01-15 DIAGNOSIS — M5136 Other intervertebral disc degeneration, lumbar region: Secondary | ICD-10-CM | POA: Diagnosis not present

## 2022-01-15 DIAGNOSIS — M9901 Segmental and somatic dysfunction of cervical region: Secondary | ICD-10-CM | POA: Diagnosis not present

## 2022-01-15 DIAGNOSIS — M9903 Segmental and somatic dysfunction of lumbar region: Secondary | ICD-10-CM | POA: Diagnosis not present

## 2022-01-20 ENCOUNTER — Institutional Professional Consult (permissible substitution): Payer: Federal, State, Local not specified - PPO | Admitting: Plastic Surgery

## 2022-01-20 DIAGNOSIS — M5136 Other intervertebral disc degeneration, lumbar region: Secondary | ICD-10-CM | POA: Diagnosis not present

## 2022-01-20 DIAGNOSIS — M9903 Segmental and somatic dysfunction of lumbar region: Secondary | ICD-10-CM | POA: Diagnosis not present

## 2022-01-20 DIAGNOSIS — M50322 Other cervical disc degeneration at C5-C6 level: Secondary | ICD-10-CM | POA: Diagnosis not present

## 2022-01-20 DIAGNOSIS — M9901 Segmental and somatic dysfunction of cervical region: Secondary | ICD-10-CM | POA: Diagnosis not present

## 2022-01-22 DIAGNOSIS — M9901 Segmental and somatic dysfunction of cervical region: Secondary | ICD-10-CM | POA: Diagnosis not present

## 2022-01-22 DIAGNOSIS — M50322 Other cervical disc degeneration at C5-C6 level: Secondary | ICD-10-CM | POA: Diagnosis not present

## 2022-01-22 DIAGNOSIS — M9903 Segmental and somatic dysfunction of lumbar region: Secondary | ICD-10-CM | POA: Diagnosis not present

## 2022-01-22 DIAGNOSIS — M5136 Other intervertebral disc degeneration, lumbar region: Secondary | ICD-10-CM | POA: Diagnosis not present

## 2022-01-25 DIAGNOSIS — M9903 Segmental and somatic dysfunction of lumbar region: Secondary | ICD-10-CM | POA: Diagnosis not present

## 2022-01-25 DIAGNOSIS — M50322 Other cervical disc degeneration at C5-C6 level: Secondary | ICD-10-CM | POA: Diagnosis not present

## 2022-01-25 DIAGNOSIS — M9901 Segmental and somatic dysfunction of cervical region: Secondary | ICD-10-CM | POA: Diagnosis not present

## 2022-01-25 DIAGNOSIS — M5136 Other intervertebral disc degeneration, lumbar region: Secondary | ICD-10-CM | POA: Diagnosis not present

## 2022-01-27 DIAGNOSIS — M5136 Other intervertebral disc degeneration, lumbar region: Secondary | ICD-10-CM | POA: Diagnosis not present

## 2022-01-27 DIAGNOSIS — M9903 Segmental and somatic dysfunction of lumbar region: Secondary | ICD-10-CM | POA: Diagnosis not present

## 2022-01-27 DIAGNOSIS — M9901 Segmental and somatic dysfunction of cervical region: Secondary | ICD-10-CM | POA: Diagnosis not present

## 2022-01-27 DIAGNOSIS — M50322 Other cervical disc degeneration at C5-C6 level: Secondary | ICD-10-CM | POA: Diagnosis not present

## 2022-01-29 DIAGNOSIS — M9901 Segmental and somatic dysfunction of cervical region: Secondary | ICD-10-CM | POA: Diagnosis not present

## 2022-01-29 DIAGNOSIS — M50322 Other cervical disc degeneration at C5-C6 level: Secondary | ICD-10-CM | POA: Diagnosis not present

## 2022-01-29 DIAGNOSIS — M5136 Other intervertebral disc degeneration, lumbar region: Secondary | ICD-10-CM | POA: Diagnosis not present

## 2022-01-29 DIAGNOSIS — M9903 Segmental and somatic dysfunction of lumbar region: Secondary | ICD-10-CM | POA: Diagnosis not present

## 2022-01-31 ENCOUNTER — Other Ambulatory Visit: Payer: Self-pay | Admitting: Family

## 2022-02-01 DIAGNOSIS — M9903 Segmental and somatic dysfunction of lumbar region: Secondary | ICD-10-CM | POA: Diagnosis not present

## 2022-02-01 DIAGNOSIS — M9901 Segmental and somatic dysfunction of cervical region: Secondary | ICD-10-CM | POA: Diagnosis not present

## 2022-02-01 DIAGNOSIS — M5136 Other intervertebral disc degeneration, lumbar region: Secondary | ICD-10-CM | POA: Diagnosis not present

## 2022-02-01 DIAGNOSIS — M50322 Other cervical disc degeneration at C5-C6 level: Secondary | ICD-10-CM | POA: Diagnosis not present

## 2022-02-03 DIAGNOSIS — M9901 Segmental and somatic dysfunction of cervical region: Secondary | ICD-10-CM | POA: Diagnosis not present

## 2022-02-03 DIAGNOSIS — M5136 Other intervertebral disc degeneration, lumbar region: Secondary | ICD-10-CM | POA: Diagnosis not present

## 2022-02-03 DIAGNOSIS — M50322 Other cervical disc degeneration at C5-C6 level: Secondary | ICD-10-CM | POA: Diagnosis not present

## 2022-02-03 DIAGNOSIS — M9903 Segmental and somatic dysfunction of lumbar region: Secondary | ICD-10-CM | POA: Diagnosis not present

## 2022-02-09 DIAGNOSIS — M50322 Other cervical disc degeneration at C5-C6 level: Secondary | ICD-10-CM | POA: Diagnosis not present

## 2022-02-09 DIAGNOSIS — M9903 Segmental and somatic dysfunction of lumbar region: Secondary | ICD-10-CM | POA: Diagnosis not present

## 2022-02-09 DIAGNOSIS — M5136 Other intervertebral disc degeneration, lumbar region: Secondary | ICD-10-CM | POA: Diagnosis not present

## 2022-02-09 DIAGNOSIS — M9901 Segmental and somatic dysfunction of cervical region: Secondary | ICD-10-CM | POA: Diagnosis not present

## 2022-03-30 ENCOUNTER — Ambulatory Visit: Payer: Federal, State, Local not specified - PPO | Admitting: Dermatology

## 2022-04-02 DIAGNOSIS — E538 Deficiency of other specified B group vitamins: Secondary | ICD-10-CM | POA: Diagnosis not present

## 2022-04-02 DIAGNOSIS — Z13220 Encounter for screening for lipoid disorders: Secondary | ICD-10-CM | POA: Diagnosis not present

## 2022-04-02 DIAGNOSIS — I10 Essential (primary) hypertension: Secondary | ICD-10-CM | POA: Diagnosis not present

## 2022-04-12 DIAGNOSIS — Z23 Encounter for immunization: Secondary | ICD-10-CM | POA: Diagnosis not present

## 2022-04-12 DIAGNOSIS — Z0000 Encounter for general adult medical examination without abnormal findings: Secondary | ICD-10-CM | POA: Diagnosis not present

## 2022-04-16 ENCOUNTER — Other Ambulatory Visit: Payer: Self-pay | Admitting: Family Medicine

## 2022-04-16 DIAGNOSIS — F17210 Nicotine dependence, cigarettes, uncomplicated: Secondary | ICD-10-CM

## 2022-04-23 ENCOUNTER — Ambulatory Visit: Admission: RE | Admit: 2022-04-23 | Payer: Federal, State, Local not specified - PPO | Source: Ambulatory Visit

## 2022-05-10 ENCOUNTER — Institutional Professional Consult (permissible substitution): Payer: Federal, State, Local not specified - PPO | Admitting: Plastic Surgery

## 2022-05-14 ENCOUNTER — Telehealth: Payer: Self-pay | Admitting: Plastic Surgery

## 2022-05-14 NOTE — Telephone Encounter (Signed)
Left message for pt that an earlier appt with a different provider has become open and if she would like this earlier appt to please call the office to change the appt.

## 2022-05-21 DIAGNOSIS — E039 Hypothyroidism, unspecified: Secondary | ICD-10-CM | POA: Diagnosis not present

## 2022-05-23 ENCOUNTER — Other Ambulatory Visit: Payer: Self-pay | Admitting: Family

## 2022-05-23 DIAGNOSIS — I10 Essential (primary) hypertension: Secondary | ICD-10-CM

## 2022-05-24 ENCOUNTER — Telehealth: Payer: Self-pay | Admitting: Plastic Surgery

## 2022-05-24 NOTE — Telephone Encounter (Signed)
LVM for patient to return call to reschedule appt on 06/14/22 due to Provider leaving practice.

## 2022-06-02 DIAGNOSIS — M9901 Segmental and somatic dysfunction of cervical region: Secondary | ICD-10-CM | POA: Diagnosis not present

## 2022-06-02 DIAGNOSIS — M25612 Stiffness of left shoulder, not elsewhere classified: Secondary | ICD-10-CM | POA: Diagnosis not present

## 2022-06-02 DIAGNOSIS — M542 Cervicalgia: Secondary | ICD-10-CM | POA: Diagnosis not present

## 2022-06-02 DIAGNOSIS — M9907 Segmental and somatic dysfunction of upper extremity: Secondary | ICD-10-CM | POA: Diagnosis not present

## 2022-06-09 DIAGNOSIS — M9901 Segmental and somatic dysfunction of cervical region: Secondary | ICD-10-CM | POA: Diagnosis not present

## 2022-06-09 DIAGNOSIS — M5136 Other intervertebral disc degeneration, lumbar region: Secondary | ICD-10-CM | POA: Diagnosis not present

## 2022-06-09 DIAGNOSIS — M50322 Other cervical disc degeneration at C5-C6 level: Secondary | ICD-10-CM | POA: Diagnosis not present

## 2022-06-09 DIAGNOSIS — M9903 Segmental and somatic dysfunction of lumbar region: Secondary | ICD-10-CM | POA: Diagnosis not present

## 2022-06-14 ENCOUNTER — Institutional Professional Consult (permissible substitution): Payer: Federal, State, Local not specified - PPO | Admitting: Plastic Surgery

## 2022-06-16 DIAGNOSIS — M9901 Segmental and somatic dysfunction of cervical region: Secondary | ICD-10-CM | POA: Diagnosis not present

## 2022-06-16 DIAGNOSIS — M5136 Other intervertebral disc degeneration, lumbar region: Secondary | ICD-10-CM | POA: Diagnosis not present

## 2022-06-16 DIAGNOSIS — M50322 Other cervical disc degeneration at C5-C6 level: Secondary | ICD-10-CM | POA: Diagnosis not present

## 2022-06-16 DIAGNOSIS — M9903 Segmental and somatic dysfunction of lumbar region: Secondary | ICD-10-CM | POA: Diagnosis not present

## 2022-06-21 ENCOUNTER — Ambulatory Visit: Payer: Federal, State, Local not specified - PPO | Admitting: Obstetrics

## 2022-07-26 ENCOUNTER — Encounter: Payer: Self-pay | Admitting: Licensed Practical Nurse

## 2022-07-26 ENCOUNTER — Other Ambulatory Visit (HOSPITAL_COMMUNITY)
Admission: RE | Admit: 2022-07-26 | Discharge: 2022-07-26 | Disposition: A | Discharge status: Home / Self Care | Payer: Federal, State, Local not specified - PPO | Source: Ambulatory Visit | Attending: Licensed Practical Nurse | Admitting: Licensed Practical Nurse

## 2022-07-26 ENCOUNTER — Ambulatory Visit (INDEPENDENT_AMBULATORY_CARE_PROVIDER_SITE_OTHER): Payer: Federal, State, Local not specified - PPO | Admitting: Licensed Practical Nurse

## 2022-07-26 VITALS — BP 111/54 | HR 78 | Ht 66.0 in | Wt 141.0 lb

## 2022-07-26 DIAGNOSIS — Z124 Encounter for screening for malignant neoplasm of cervix: Secondary | ICD-10-CM | POA: Insufficient documentation

## 2022-07-26 DIAGNOSIS — Z01419 Encounter for gynecological examination (general) (routine) without abnormal findings: Secondary | ICD-10-CM | POA: Insufficient documentation

## 2022-07-26 NOTE — Progress Notes (Signed)
Gynecology Annual Exam  PCP: Ellene Route  Chief Complaint:  Chief Complaint  Patient presents with   Annual Exam    History of Present Illness:Patient is a 51 y.o. [id number] presents for annual exam. The patient has no complaints today.   LMP: No LMP recorded. Patient is perimenopausal. No cycle for 13 or 14 months  Trying to quit smoking, now half a pack a day    The patient is not currently sexually active. She denies dyspareunia.  The patient does perform self breast exams.  There is no notable family history of breast or ovarian cancer in her family. Maternal grandmother had Breast Cancer    The patient wears seatbelts: yes.   The patient has regular exercise: yes.  Walks, gym  Works for [employer] at from home Lives with her daughter PCP in [place]   The patient reports current symptoms of depression. Improved with increasing dosage of Paxil.    Admits to various levels of stress, likes to vacuum, be with her animals and take walks to relieve stress   Review of Systems: Review of Systems  Constitutional:  Positive for malaise/fatigue.  Eyes: Negative.   Respiratory:  Positive for cough.   Cardiovascular: Negative.   Gastrointestinal:  Positive for diarrhea, nausea and vomiting.  Genitourinary: Negative.   Musculoskeletal: Negative.   Skin: Negative.   Neurological:  Positive for headaches.  Endo/Heme/Allergies:  Positive for environmental allergies.       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 Patient is perimenopausal. Last Pap: Results were: 2022 NIL and HR HPV+  Last mammogram: 2022 Results were: BI-RAD I  Obstetric History: [id number]  Family History:  Family History  Problem Relation Age of Onset   Kidney disease Mother    Breast cancer Maternal Grandmother 70    Social History:  Social History   Socioeconomic History   Marital status: Single    Spouse name: Not on file   Number of children: Not on file   Years of education: Not on file   Highest education level: Not on file  Occupational History   Not on file  Tobacco Use   Smoking status: Every Day    Packs/day: 0.50    Years: 30.00    Total pack years: 15.00    Types: Cigarettes   Smokeless tobacco: Never  Vaping Use   Vaping Use: Never used  Substance and Sexual Activity   Alcohol use: Never   Drug use: Never   Sexual activity: Not Currently  Other Topics Concern   Not on file  Social History Narrative   Tobacco use, amount per day now: 1/2 Pack  Past tobacco use, amount per day:    How many years did you use tobacco: 30 years   Alcohol use (drinks per week): None.   Diet:   Do you drink/eat things with caffeine: Yes   Marital status:  Single                                What year were you married?   Do you live in a house, apartment, assisted living, condo, trailer, etc.? House   Is it one or more stories? 1   How many persons live in your home? 2   Do you have pets in your home?( please list) 2 Dogs.   Highest Level of education completed? Some college.   Current or past profession: [employer]   Do you exercise? Yes                                  Type and how often? Walking everyday.    Do you have a living will? Yes   Do you have a DNR  form?  No                                 If not, do you want to discuss one?   Do you have signed POA/HPOA forms? No                       If so, please bring to you appointment      Do you have any difficulty bathing or dressing yourself? No   Do you have any difficulty preparing food or eating? No   Do you have any difficulty managing your medications? No   Do you have any difficulty managing your finances? No   Do you have any difficulty affording your medications? No   Social Determinants of Radio broadcast assistant Strain: Not on file  Food Insecurity: Not on file  Transportation Needs: Not on file  Physical Activity: Not on file  Stress: Not on file  Social Connections: Not on file  Intimate Partner Violence: Not on file    Allergies:  Allergies  Allergen Reactions   Penicillin G Other (See Comments)   Penicillins    Sulfa Antibiotics Hives    Medications: Prior to Admission medications   Medication Sig Start Date End Date Taking? Authorizing Provider  albuterol (VENTOLIN HFA) 108 (90 Base) MCG/ACT inhaler Inhale 1-2 puffs into the lungs every 6 (six) hours as needed for wheezing or shortness of breath.   Yes [provider]  Cyanocobalamin (VITAMIN B-12 PO) Take 1 capsule by mouth daily.   Yes [provider]  levothyroxine (SYNTHROID) 137 MCG tablet TAKE 1 TABLET BY MOUTH DAILY BEFORE BREAKFAST. 02/01/22  Yes Ngetich, Dinah C, NP  losartan (COZAAR) 100 MG tablet Take 1 tablet (100 mg total) by mouth daily. 06/08/21  Yes Ngetich, Dinah C, NP  Multiple Vitamins-Minerals (BARIATRIC MULTIVITAMINS/IRON PO) Take 1 capsule by mouth daily.   Yes [provider]  diazepam (VALIUM) 2 MG tablet Take 1 tablet (2 mg total) by mouth as needed for anxiety. Patient not taking: Reported on 07/26/2022 10/20/21   Ngetich, Dinah C, NP  PARoxetine (PAXIL) 30 MG tablet Take 1 tablet (30 mg total) by mouth daily. 09/07/21  12/06/21  Ngetich, Dinah C, NP  Sod  Picosulfate-Mag Ox-Cit Acd (CLENPIQ) 10-3.5-12 MG-GM -GM/160ML SOLN Take 1 bottle at 5 PM followed by five 8 oz cups of water and repeat 5 hours before procedure. Patient not taking: Reported on 07/26/2022 04/07/21   Jonathon Bellows, MD  Varenicline Tartrate, Starter, 0.5 MG X 11 & 1 MG X 42 TBPK Take 1 tablet by mouth 2 (two) times daily.    [provider]    Physical Exam Vitals: Blood pressure (!) 111/54, pulse 78, height '5\' 6"'$  (1.676 m), weight 141 lb (64 kg).  General: NAD HEENT: normocephalic, anicteric Thyroid: no enlargement, no palpable nodules Pulmonary: No increased work of breathing, CTAB Cardiovascular: RRR, distal pulses 2+ Breast: Breast symmetrical, no tenderness, no palpable nodules or masses, no skin or nipple retraction present, no nipple discharge.  No axillary or supraclavicular lymphadenopathy. Abdomen: NABS, soft, non-tender, non-distended.  Umbilicus without lesions.  No hepatomegaly, splenomegaly or masses palpable. No evidence of hernia  Genitourinary:  External: Normal external female genitalia.  Normal urethral meatus, normal Bartholin's and Skene's glands.    Vagina: Normal vaginal mucosa, no evidence of prolapse.    Cervix: Grossly normal in appearance, no bleeding  Uterus: Non-enlarged, mobile, normal contour.  No CMT  Adnexa: ovaries non-enlarged, no adnexal masses  Rectal: deferred  Lymphatic: no evidence of inguinal lymphadenopathy Extremities: no edema, erythema, or tenderness Neurologic: Grossly intact Psychiatric: mood appropriate, affect full  Female chaperone present for pelvic and breast  portions of the physical exam     Assessment: 51 y.o. [id number] routine annual exam  Plan: Problem List Items Addressed This Visit   None Visit Diagnoses     Well woman exam    -  Primary   Relevant Orders   Cytology - PAP   MM DIGITAL SCREENING BILATERAL   Cervical cancer screening       Relevant Orders   Cytology - PAP       1) Mammogram -  recommend yearly screening mammogram.  Mammogram Was ordered today  2) STI screening  wasoffered and declined  3) ASCCP guidelines and rational discussed.  Patient opts for  based on results   screening interval  4) Osteoporosis  - per USPTF routine screening DEXA at age 31   Consider FDA-approved medical therapies in postmenopausal women and men aged 22 years and older, based on the following: a) A hip or vertebral (clinical or morphometric) fracture b) T-score ? -2.5 at the femoral neck or spine after appropriate evaluation to exclude secondary causes C) Low bone mass (T-score between -1.0 and -2.5 at the femoral neck or spine) and a 10-year probability of a hip fracture ? 3% or a 10-year probability of a major osteoporosis-related fracture ? 20% based on the [place]-adapted WHO algorithm   5) Routine healthcare maintenance including cholesterol, diabetes screening discussed managed by PCP  6) Colonoscopy UP to date .  Screening recommended starting at age 73 for average risk individuals, age 32 for individuals deemed at increased risk (including African Americans) and recommended to continue until age 14.  For patient age 73-85 individualized approach is recommended.  Gold standard screening is via colonoscopy, Cologuard screening is an acceptable alternative for patient unwilling or unable to undergo colonoscopy.  "Colorectal cancer screening for average?risk adults: 2018 guideline update from the American Cancer Society"[place]: A Cancer Journal for Clinicians: Jan 19, 2017   7) No follow-ups on file.   Roberto Scales, CNM  [facility] Pigeon, [facility]  08/01/2022, 3:10 PM

## 2022-08-06 ENCOUNTER — Encounter: Payer: Self-pay | Admitting: Licensed Practical Nurse

## 2022-08-06 ENCOUNTER — Telehealth: Payer: Self-pay | Admitting: Obstetrics & Gynecology

## 2022-08-06 ENCOUNTER — Telehealth: Payer: Self-pay

## 2022-08-06 LAB — CYTOLOGY - PAP
Comment: NEGATIVE
Comment: NEGATIVE
Comment: NEGATIVE
HPV 16: NEGATIVE
HPV 18 / 45: NEGATIVE
High risk HPV: POSITIVE — AB

## 2022-08-06 NOTE — Telephone Encounter (Signed)
Reached out to pt to schedule a colpo with Dr. Hulan Fray (per LMD).  Left message for pt to call back to schedule.

## 2022-08-06 NOTE — Telephone Encounter (Signed)
Pt is scheduled with Dr. Hulan Fray on 09/14/22 at 1:15 for a colpo.

## 2022-08-17 ENCOUNTER — Institutional Professional Consult (permissible substitution): Payer: Federal, State, Local not specified - PPO | Admitting: Plastic Surgery

## 2022-08-26 ENCOUNTER — Ambulatory Visit: Payer: Federal, State, Local not specified - PPO | Admitting: Obstetrics & Gynecology

## 2022-09-14 ENCOUNTER — Encounter: Payer: Self-pay | Admitting: Obstetrics & Gynecology

## 2022-09-14 ENCOUNTER — Ambulatory Visit (INDEPENDENT_AMBULATORY_CARE_PROVIDER_SITE_OTHER): Payer: Federal, State, Local not specified - PPO | Admitting: Obstetrics & Gynecology

## 2022-09-14 ENCOUNTER — Other Ambulatory Visit (HOSPITAL_COMMUNITY)
Admission: RE | Admit: 2022-09-14 | Discharge: 2022-09-14 | Disposition: A | Discharge status: Home / Self Care | Payer: Federal, State, Local not specified - PPO | Source: Ambulatory Visit | Attending: Obstetrics & Gynecology | Admitting: Obstetrics & Gynecology

## 2022-09-14 VITALS — BP 107/80 | HR 71 | Resp 16 | Wt 142.5 lb

## 2022-09-14 DIAGNOSIS — B977 Papillomavirus as the cause of diseases classified elsewhere: Secondary | ICD-10-CM

## 2022-09-14 DIAGNOSIS — N870 Mild cervical dysplasia: Secondary | ICD-10-CM | POA: Diagnosis not present

## 2022-09-14 DIAGNOSIS — R87612 Low grade squamous intraepithelial lesion on cytologic smear of cervix (LGSIL): Secondary | ICD-10-CM

## 2022-09-14 NOTE — Progress Notes (Signed)
   Established Patient Office Visit  Subjective   Patient ID: Caitlin Weaver, female    DOB: Dec 28, 1970  Age: 52 y.o. MRN: [id number]  Chief Complaint  Patient presents with   Colposcopy    HPI   52 yo P1 here for a colpo due to a LGSIL pap last month. 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 She had genital warts treated in the distant past.  Objective:     BP 107/80   Pulse 71   Resp 16   Wt 142 lb 8 oz (64.6 kg)   BMI 23.00 kg/m    Physical Exam Well nourished, well hydrated White female, no apparent distress She is ambulating and conversing normally. Consent signed, time out done Cervix prepped with acetic acid. Transformation zone seen in its entirety. Colpo adequate. Colpo findings all normal. ECC obtained. She tolerated the procedure well.  Assessment & Plan:   Problem List Items Addressed This Visit   None Visit Diagnoses     LGSIL on Pap smear of cervix    -  Primary   Relevant Orders   Colposcopy   High risk HPV infection       Relevant Orders   Colposcopy      If ECC is normal, repeat pap in a year. If not, rec LEEP. She is aware of this plan and I will send her a message on Monday.    Emily Filbert, MD

## 2022-09-16 ENCOUNTER — Encounter: Payer: Self-pay | Admitting: Obstetrics & Gynecology

## 2022-09-20 LAB — SURGICAL PATHOLOGY

## 2022-09-21 ENCOUNTER — Encounter: Payer: Self-pay | Admitting: Obstetrics & Gynecology

## 2022-10-18 ENCOUNTER — Other Ambulatory Visit (HOSPITAL_COMMUNITY)
Admission: RE | Admit: 2022-10-18 | Discharge: 2022-10-18 | Disposition: A | Discharge status: Home / Self Care | Payer: Federal, State, Local not specified - PPO | Source: Ambulatory Visit | Attending: Obstetrics & Gynecology | Admitting: Obstetrics & Gynecology

## 2022-10-18 ENCOUNTER — Encounter: Payer: Self-pay | Admitting: Obstetrics & Gynecology

## 2022-10-18 ENCOUNTER — Ambulatory Visit (INDEPENDENT_AMBULATORY_CARE_PROVIDER_SITE_OTHER): Payer: Federal, State, Local not specified - PPO | Admitting: Obstetrics & Gynecology

## 2022-10-18 VITALS — BP 159/88 | HR 88 | Ht 66.0 in | Wt 140.0 lb

## 2022-10-18 DIAGNOSIS — N870 Mild cervical dysplasia: Secondary | ICD-10-CM

## 2022-10-18 NOTE — Progress Notes (Signed)
    GYNECOLOGY PROGRESS NOTE  Subjective:    Patient ID: Caitlin Weaver, female    DOB: 1970/09/15, 52 y.o.   MRN: [id number]  HPI  Patient is a 52 y.o. [id number] who presents for a LEEP due to CIN 1 seen on ECC last month. She had a normal colpo due to LGISL. She reports decades of HR HPV. She had genital warts in the past.  She has been abstinent for years. She no longer has periods.  The following portions of the patient's history were reviewed and updated as appropriate: allergies, current medications, past family history, past medical history, past social history, past surgical history, and problem list.  Review of Systems Pertinent items are noted in HPI.   Objective:   Blood pressure (!) 159/88, pulse 88, height 5' 6"$  (1.676 m), weight 140 lb (63.5 kg). Body mass index is 22.6 kg/m. General appearance: alert Abdomen: soft, non-tender; bowel sounds normal; no masses,  no organomegaly Pelvic: cervix normal in appearance, external genitalia normal, no adnexal masses or tenderness, no cervical motion tenderness, uterus normal size, shape, and consistency, and vagina normal without discharge Extremities: extremities normal, atraumatic, no cyanosis or edema Neurologic: Grossly normal   Colpo Biopsy:   Risks, benefits, alternatives, and limitations of procedure explained to patient, including pain, bleeding, infection, failure to remove abnormal tissue and failure to cure dysplasia, need for repeat procedures, damage to pelvic organs, cervical incompetence.  Role of HPV,cervical dysplasia and need for close followup was empasized. Informed written consent was obtained. All questions were answered. Time out performed. Urine pregnancy test was negative.  ??Procedure: The patient was placed in lithotomy position and the bivalved coated speculum was placed in the patient's vagina. A grounding pad placed on the patient. Acetic acid was applied to the cervix and all appeared normal. I sprayed  Hurricaine spray.  Local anesthesia was administered via an intracervical block using 15cc of 2% Lidocaine with epinephrine. The suction was turned on and the medium semicircular LEEP tip on 38 Watts of cutting current and cautery was used to excise the entire transformation zone and any areas of visible dysplasia. I obtained an ECC.  Excellent hemostasis was achieved using roller ball coagulation set at 50 Watts coagulation current. As per my usual, I coated the cone bed with Monsel's. The speculum was removed from the vagina. Specimens were sent to pathology.  ?The patient tolerated the procedure well. Post-operative instructions given to patient, including instruction to seek medical attention for persistent bright red bleeding, fever, abdominal/pelvic pain, dysuria, nausea or vomiting. She was also told about the possibility of having copious yellow to black tinged discharge for weeks. She was counseled to avoid anything in the vagina (sex/douching/tampons) for 3 weeks.  Assessment:   1. CIN I (cervical intraepithelial neoplasia I)      Plan:   1. CIN I (cervical intraepithelial neoplasia I)  - Surgical pathology - LEEP with colposcopy - I will send her a message on Monday regarding her pathology results.

## 2022-10-19 ENCOUNTER — Encounter: Payer: Self-pay | Admitting: Obstetrics & Gynecology

## 2022-10-19 LAB — SURGICAL PATHOLOGY

## 2022-11-22 ENCOUNTER — Other Ambulatory Visit: Payer: Self-pay | Admitting: Obstetrics & Gynecology

## 2022-11-22 DIAGNOSIS — Z1231 Encounter for screening mammogram for malignant neoplasm of breast: Secondary | ICD-10-CM

## 2022-12-22 IMAGING — MG MM DIGITAL SCREENING BILAT W/ TOMO AND CAD
8 series · 8 of 24 positions shown · non-contrast
Comparison: None.

CLINICAL DATA: Screening.

EXAM:
DIGITAL SCREENING BILATERAL MAMMOGRAM WITH TOMOSYNTHESIS AND CAD
TECHNIQUE: Bilateral screening digital craniocaudal and mediolateral oblique
mammograms were obtained. Bilateral screening digital breast
tomosynthesis was performed. The images were evaluated with
computer-aided detection.

[R MLO synth-2D]
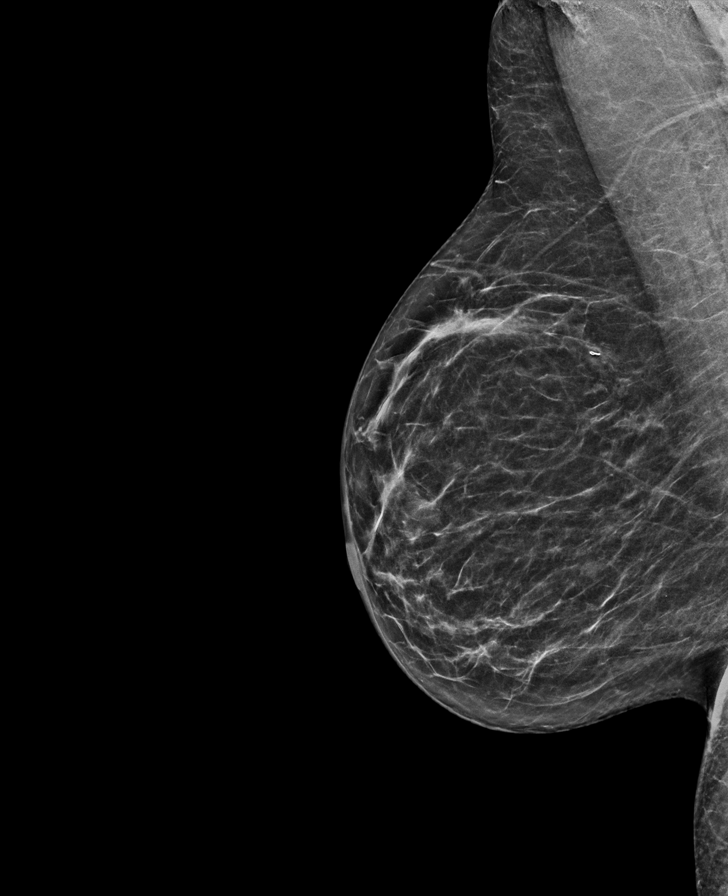

[R CC synth-2D]
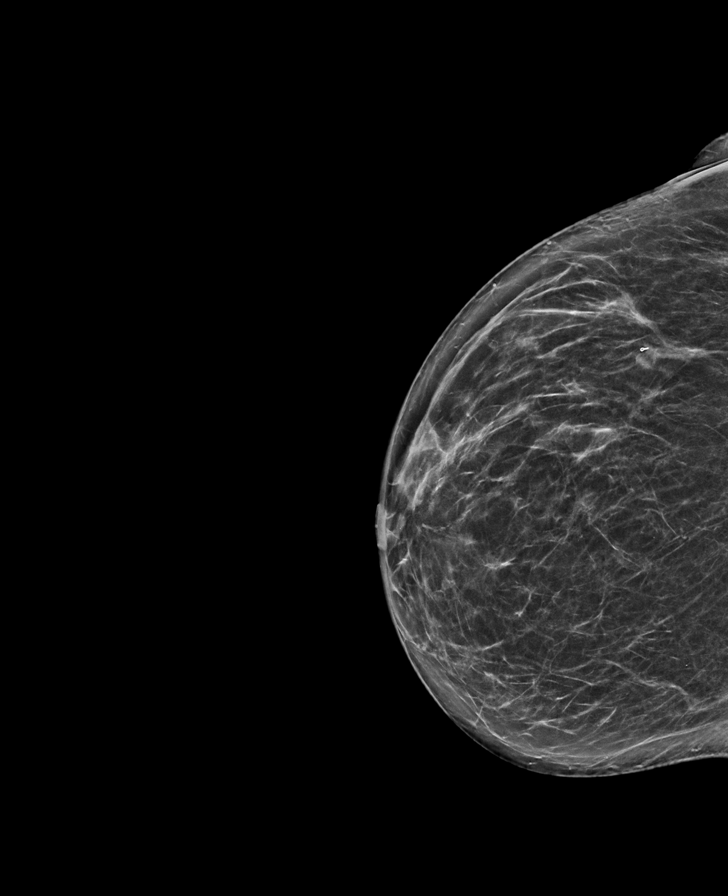

[L MLO synth-2D]
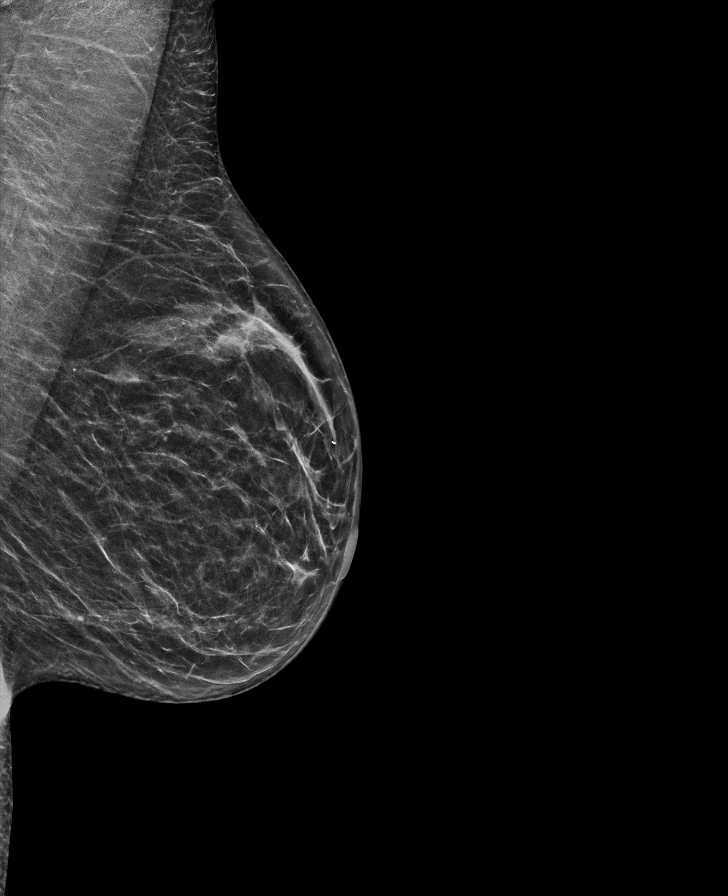

[L CC synth-2D]
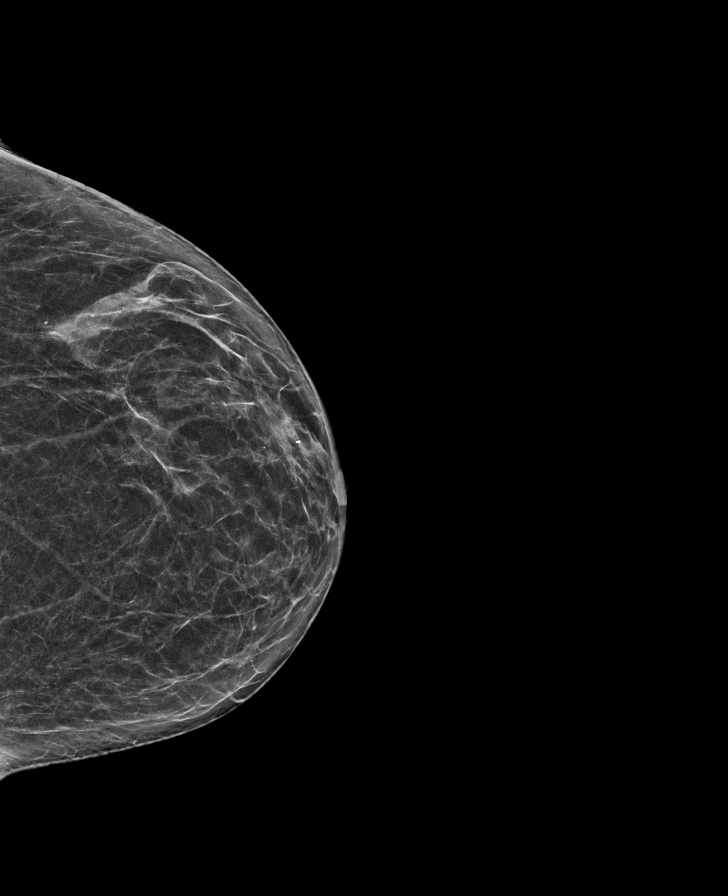

[L MLO tomo · tomo slice 31/62.0]
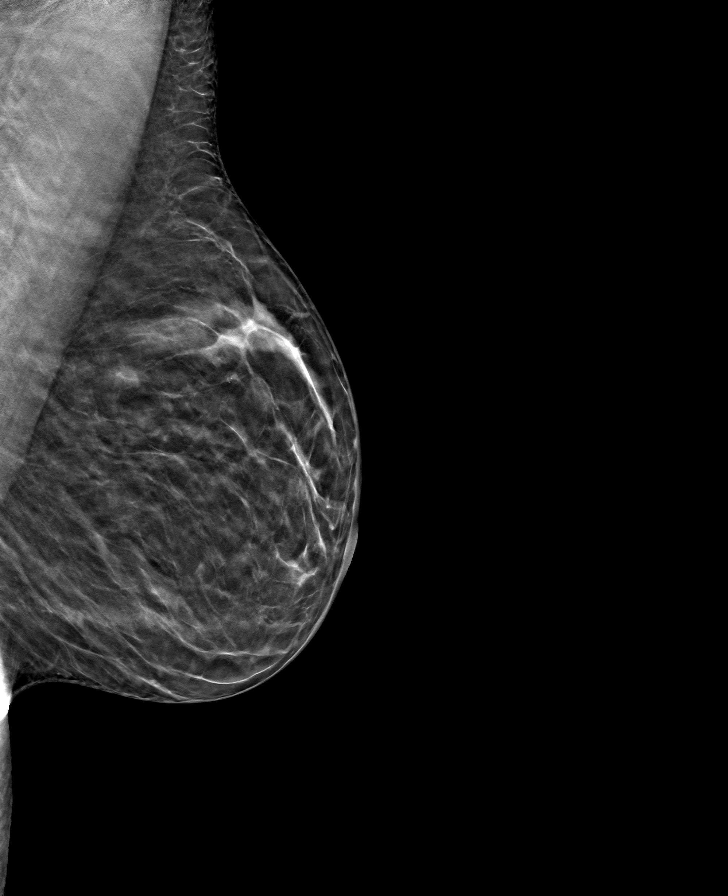

[R CC tomo · tomo slice 35/70.0]
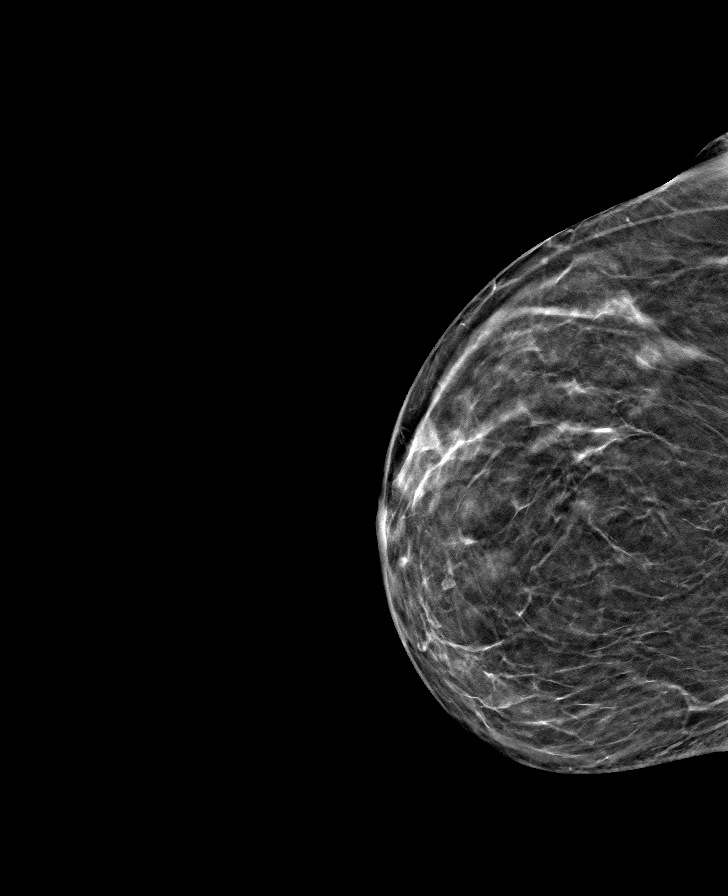

[L CC tomo · tomo slice 33/66.0]
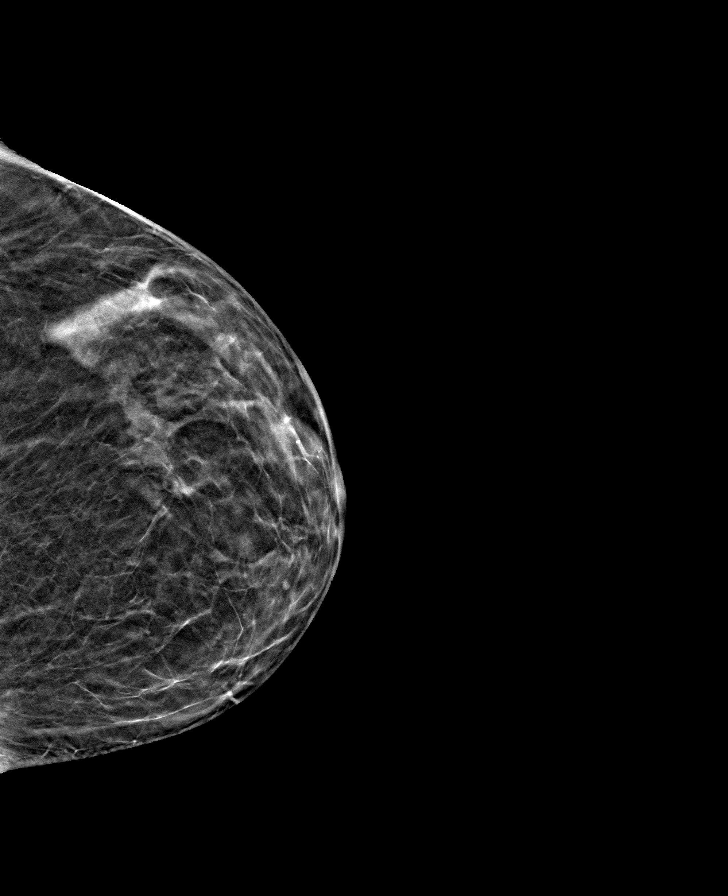

[R MLO tomo · tomo slice 29/58.0]
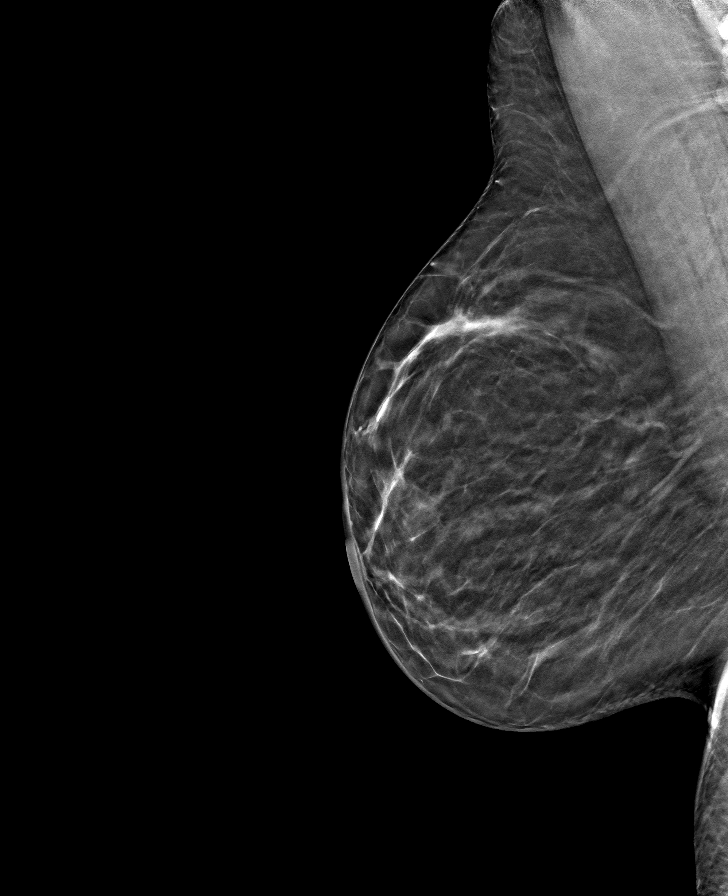

[8 of 24 positions shown; findings below may reference images not displayed]

ACR Breast Density Category b: There are scattered areas of
fibroglandular density.
FINDINGS: There are no findings suspicious for malignancy.
IMPRESSION: No mammographic evidence of malignancy. A result letter of this
screening mammogram will be mailed directly to the patient.

RECOMMENDATION:
Screening mammogram in one year. (Code:XG-X-X7B)

BI-RADS CATEGORY  1: Negative.

## 2023-01-06 ENCOUNTER — Ambulatory Visit
Admission: RE | Admit: 2023-01-06 | Discharge: 2023-01-06 | Disposition: A | Discharge status: Home / Self Care | Payer: Federal, State, Local not specified - PPO | Source: Ambulatory Visit | Attending: Obstetrics & Gynecology | Admitting: Obstetrics & Gynecology

## 2023-01-06 DIAGNOSIS — Z1231 Encounter for screening mammogram for malignant neoplasm of breast: Secondary | ICD-10-CM | POA: Diagnosis not present

## 2024-05-09 ENCOUNTER — Other Ambulatory Visit: Payer: Self-pay | Admitting: Family Medicine

## 2024-05-09 DIAGNOSIS — Z1231 Encounter for screening mammogram for malignant neoplasm of breast: Secondary | ICD-10-CM

## 2024-05-10 ENCOUNTER — Ambulatory Visit
Admission: RE | Admit: 2024-05-10 | Discharge: 2024-05-10 | Disposition: A | Discharge status: Home / Self Care | Source: Ambulatory Visit | Attending: Family Medicine | Admitting: Family Medicine

## 2024-05-10 ENCOUNTER — Encounter: Payer: Self-pay | Admitting: Radiology

## 2024-05-10 DIAGNOSIS — Z1231 Encounter for screening mammogram for malignant neoplasm of breast: Secondary | ICD-10-CM | POA: Diagnosis present

## 2024-05-17 ENCOUNTER — Other Ambulatory Visit: Payer: Self-pay | Admitting: Family Medicine

## 2024-05-17 DIAGNOSIS — N6489 Other specified disorders of breast: Secondary | ICD-10-CM

## 2024-05-17 DIAGNOSIS — R928 Other abnormal and inconclusive findings on diagnostic imaging of breast: Secondary | ICD-10-CM

## 2024-05-21 ENCOUNTER — Other Ambulatory Visit

## 2024-05-21 ENCOUNTER — Inpatient Hospital Stay: Admission: RE | Admit: 2024-05-21 | Source: Ambulatory Visit

## 2024-05-25 ENCOUNTER — Ambulatory Visit
Admission: RE | Admit: 2024-05-25 | Discharge: 2024-05-25 | Disposition: A | Discharge status: Home / Self Care | Source: Ambulatory Visit | Attending: Family Medicine | Admitting: Family Medicine

## 2024-05-25 DIAGNOSIS — N6489 Other specified disorders of breast: Secondary | ICD-10-CM | POA: Insufficient documentation

## 2024-05-25 DIAGNOSIS — R928 Other abnormal and inconclusive findings on diagnostic imaging of breast: Secondary | ICD-10-CM | POA: Insufficient documentation
